# Patient Record
Sex: Female | Born: 1955 | Race: White | Hispanic: No | Smoking: Former smoker
Health system: Southern US, Community
[De-identification: ages and names within clinical notes are randomized; demographics above are authoritative.]

## PROBLEM LIST (undated history)

## (undated) DIAGNOSIS — N840 Polyp of corpus uteri: Secondary | ICD-10-CM

## (undated) DIAGNOSIS — E079 Disorder of thyroid, unspecified: Secondary | ICD-10-CM

## (undated) DIAGNOSIS — Z1501 Genetic susceptibility to malignant neoplasm of breast: Secondary | ICD-10-CM

## (undated) DIAGNOSIS — Z9229 Personal history of other drug therapy: Secondary | ICD-10-CM

## (undated) DIAGNOSIS — Z1509 Genetic susceptibility to other malignant neoplasm: Secondary | ICD-10-CM

## (undated) DIAGNOSIS — Z9852 Vasectomy status: Secondary | ICD-10-CM

## (undated) DIAGNOSIS — Z789 Other specified health status: Secondary | ICD-10-CM

## (undated) DIAGNOSIS — M1990 Unspecified osteoarthritis, unspecified site: Secondary | ICD-10-CM

## (undated) DIAGNOSIS — R928 Other abnormal and inconclusive findings on diagnostic imaging of breast: Secondary | ICD-10-CM

## (undated) HISTORY — DX: Vasectomy status: Z98.52

## (undated) HISTORY — DX: Disorder of thyroid, unspecified: E07.9

## (undated) HISTORY — DX: Polyp of corpus uteri: N84.0

## (undated) HISTORY — PX: HYSTEROSCOPY: SHX211

## (undated) HISTORY — PX: BREAST SURGERY: SHX581

## (undated) HISTORY — DX: Personal history of other drug therapy: Z92.29

## (undated) HISTORY — PX: TONSILLECTOMY: SUR1361

## (undated) HISTORY — DX: Other abnormal and inconclusive findings on diagnostic imaging of breast: R92.8

---

## 1998-01-18 ENCOUNTER — Other Ambulatory Visit: Admission: RE | Admit: 1998-01-18 | Discharge: 1998-01-18 | Payer: Self-pay | Admitting: Obstetrics and Gynecology

## 1998-03-14 ENCOUNTER — Other Ambulatory Visit: Admission: RE | Admit: 1998-03-14 | Discharge: 1998-03-14 | Payer: Self-pay | Admitting: Obstetrics and Gynecology

## 1998-10-04 ENCOUNTER — Other Ambulatory Visit: Admission: RE | Admit: 1998-10-04 | Discharge: 1998-10-04 | Payer: Self-pay | Admitting: Obstetrics and Gynecology

## 1999-02-02 ENCOUNTER — Other Ambulatory Visit: Admission: RE | Admit: 1999-02-02 | Discharge: 1999-02-02 | Payer: Self-pay | Admitting: Obstetrics and Gynecology

## 1999-05-11 ENCOUNTER — Encounter: Admission: RE | Admit: 1999-05-11 | Discharge: 1999-05-11 | Payer: Self-pay | Admitting: Sports Medicine

## 1999-08-22 ENCOUNTER — Ambulatory Visit (HOSPITAL_COMMUNITY): Admission: RE | Admit: 1999-08-22 | Discharge: 1999-08-22 | Payer: Self-pay | Admitting: Obstetrics and Gynecology

## 1999-08-22 ENCOUNTER — Encounter (INDEPENDENT_AMBULATORY_CARE_PROVIDER_SITE_OTHER): Payer: Self-pay

## 2000-02-07 ENCOUNTER — Other Ambulatory Visit: Admission: RE | Admit: 2000-02-07 | Discharge: 2000-02-07 | Payer: Self-pay | Admitting: Obstetrics and Gynecology

## 2001-02-12 ENCOUNTER — Other Ambulatory Visit: Admission: RE | Admit: 2001-02-12 | Discharge: 2001-02-12 | Payer: Self-pay | Admitting: Obstetrics and Gynecology

## 2001-11-23 ENCOUNTER — Encounter: Payer: Self-pay | Admitting: Family Medicine

## 2001-11-23 ENCOUNTER — Encounter: Admission: RE | Admit: 2001-11-23 | Discharge: 2001-11-23 | Payer: Self-pay | Admitting: Family Medicine

## 2002-02-01 ENCOUNTER — Ambulatory Visit (HOSPITAL_COMMUNITY): Admission: RE | Admit: 2002-02-01 | Discharge: 2002-02-01 | Payer: Self-pay | Admitting: Gastroenterology

## 2002-03-09 ENCOUNTER — Other Ambulatory Visit: Admission: RE | Admit: 2002-03-09 | Discharge: 2002-03-09 | Payer: Self-pay | Admitting: Obstetrics and Gynecology

## 2002-11-19 ENCOUNTER — Encounter: Admission: RE | Admit: 2002-11-19 | Discharge: 2002-11-19 | Payer: Self-pay | Admitting: Sports Medicine

## 2002-12-28 ENCOUNTER — Encounter: Admission: RE | Admit: 2002-12-28 | Discharge: 2002-12-28 | Payer: Self-pay | Admitting: Sports Medicine

## 2003-03-22 ENCOUNTER — Other Ambulatory Visit: Admission: RE | Admit: 2003-03-22 | Discharge: 2003-03-22 | Payer: Self-pay | Admitting: Obstetrics and Gynecology

## 2003-10-25 ENCOUNTER — Encounter: Admission: RE | Admit: 2003-10-25 | Discharge: 2003-10-25 | Payer: Self-pay | Admitting: Sports Medicine

## 2003-11-22 ENCOUNTER — Encounter: Admission: RE | Admit: 2003-11-22 | Discharge: 2003-11-22 | Payer: Self-pay | Admitting: Sports Medicine

## 2004-03-29 ENCOUNTER — Other Ambulatory Visit: Admission: RE | Admit: 2004-03-29 | Discharge: 2004-03-29 | Payer: Self-pay | Admitting: Obstetrics and Gynecology

## 2004-05-22 ENCOUNTER — Encounter: Admission: RE | Admit: 2004-05-22 | Discharge: 2004-05-22 | Payer: Self-pay | Admitting: Family Medicine

## 2004-09-07 ENCOUNTER — Ambulatory Visit: Payer: Self-pay | Admitting: Family Medicine

## 2005-04-02 ENCOUNTER — Other Ambulatory Visit: Admission: RE | Admit: 2005-04-02 | Discharge: 2005-04-02 | Payer: Self-pay | Admitting: Obstetrics and Gynecology

## 2005-06-12 ENCOUNTER — Encounter: Admission: RE | Admit: 2005-06-12 | Discharge: 2005-06-12 | Payer: Self-pay | Admitting: *Deleted

## 2005-06-18 ENCOUNTER — Encounter: Admission: RE | Admit: 2005-06-18 | Discharge: 2005-06-18 | Payer: Self-pay | Admitting: Family Medicine

## 2006-04-04 ENCOUNTER — Other Ambulatory Visit: Admission: RE | Admit: 2006-04-04 | Discharge: 2006-04-04 | Payer: Self-pay | Admitting: Obstetrics and Gynecology

## 2007-04-09 ENCOUNTER — Other Ambulatory Visit: Admission: RE | Admit: 2007-04-09 | Discharge: 2007-04-09 | Payer: Self-pay | Admitting: Obstetrics and Gynecology

## 2007-09-16 ENCOUNTER — Encounter: Admission: RE | Admit: 2007-09-16 | Discharge: 2007-09-16 | Payer: Self-pay | Admitting: Gastroenterology

## 2008-04-14 ENCOUNTER — Encounter: Payer: Self-pay | Admitting: Obstetrics and Gynecology

## 2008-04-14 ENCOUNTER — Other Ambulatory Visit: Admission: RE | Admit: 2008-04-14 | Discharge: 2008-04-14 | Payer: Self-pay | Admitting: Obstetrics and Gynecology

## 2008-04-14 ENCOUNTER — Ambulatory Visit: Payer: Self-pay | Admitting: Obstetrics and Gynecology

## 2008-06-06 ENCOUNTER — Ambulatory Visit: Payer: Self-pay | Admitting: Obstetrics and Gynecology

## 2008-08-22 ENCOUNTER — Ambulatory Visit: Payer: Self-pay | Admitting: Sports Medicine

## 2008-08-22 DIAGNOSIS — R109 Unspecified abdominal pain: Secondary | ICD-10-CM

## 2008-11-10 ENCOUNTER — Ambulatory Visit: Payer: Self-pay | Admitting: Sports Medicine

## 2008-11-10 DIAGNOSIS — M79609 Pain in unspecified limb: Secondary | ICD-10-CM

## 2008-11-10 DIAGNOSIS — M7750 Other enthesopathy of unspecified foot: Secondary | ICD-10-CM | POA: Insufficient documentation

## 2009-02-14 ENCOUNTER — Ambulatory Visit: Payer: Self-pay | Admitting: Sports Medicine

## 2009-02-14 DIAGNOSIS — M722 Plantar fascial fibromatosis: Secondary | ICD-10-CM

## 2009-03-08 ENCOUNTER — Ambulatory Visit: Payer: Self-pay | Admitting: Sports Medicine

## 2009-04-19 ENCOUNTER — Telehealth: Payer: Self-pay | Admitting: Family Medicine

## 2009-04-19 ENCOUNTER — Ambulatory Visit: Payer: Self-pay | Admitting: Sports Medicine

## 2009-04-19 ENCOUNTER — Encounter: Admission: RE | Admit: 2009-04-19 | Discharge: 2009-04-19 | Payer: Self-pay | Admitting: Sports Medicine

## 2009-04-19 DIAGNOSIS — M25559 Pain in unspecified hip: Secondary | ICD-10-CM

## 2009-04-19 DIAGNOSIS — M545 Low back pain: Secondary | ICD-10-CM

## 2009-05-10 ENCOUNTER — Ambulatory Visit: Payer: Self-pay | Admitting: Obstetrics and Gynecology

## 2009-05-10 ENCOUNTER — Other Ambulatory Visit: Admission: RE | Admit: 2009-05-10 | Discharge: 2009-05-10 | Payer: Self-pay | Admitting: Obstetrics and Gynecology

## 2009-08-30 ENCOUNTER — Ambulatory Visit: Payer: Self-pay | Admitting: Obstetrics and Gynecology

## 2010-01-17 ENCOUNTER — Ambulatory Visit: Payer: Self-pay | Admitting: Obstetrics and Gynecology

## 2010-01-31 ENCOUNTER — Ambulatory Visit: Payer: Self-pay | Admitting: Obstetrics and Gynecology

## 2010-05-27 DIAGNOSIS — R928 Other abnormal and inconclusive findings on diagnostic imaging of breast: Secondary | ICD-10-CM

## 2010-05-27 HISTORY — DX: Other abnormal and inconclusive findings on diagnostic imaging of breast: R92.8

## 2010-05-27 HISTORY — PX: BREAST BIOPSY: SHX20

## 2010-05-29 ENCOUNTER — Other Ambulatory Visit
Admission: RE | Admit: 2010-05-29 | Discharge: 2010-05-29 | Payer: Self-pay | Source: Home / Self Care | Admitting: Obstetrics and Gynecology

## 2010-05-29 ENCOUNTER — Ambulatory Visit
Admission: RE | Admit: 2010-05-29 | Discharge: 2010-05-29 | Payer: Self-pay | Source: Home / Self Care | Attending: Obstetrics and Gynecology | Admitting: Obstetrics and Gynecology

## 2010-06-12 ENCOUNTER — Encounter
Admission: RE | Admit: 2010-06-12 | Discharge: 2010-06-12 | Payer: Self-pay | Source: Home / Self Care | Attending: Radiology | Admitting: Radiology

## 2010-06-20 ENCOUNTER — Encounter
Admission: RE | Admit: 2010-06-20 | Discharge: 2010-06-20 | Payer: Self-pay | Source: Home / Self Care | Attending: Radiology | Admitting: Radiology

## 2010-06-20 ENCOUNTER — Other Ambulatory Visit: Payer: Self-pay | Admitting: Diagnostic Radiology

## 2010-07-16 ENCOUNTER — Institutional Professional Consult (permissible substitution) (INDEPENDENT_AMBULATORY_CARE_PROVIDER_SITE_OTHER): Payer: BC Managed Care – PPO | Admitting: Obstetrics and Gynecology

## 2010-07-16 DIAGNOSIS — N63 Unspecified lump in unspecified breast: Secondary | ICD-10-CM

## 2010-07-17 ENCOUNTER — Institutional Professional Consult (permissible substitution): Payer: Self-pay | Admitting: Obstetrics and Gynecology

## 2010-10-12 NOTE — Op Note (Signed)
   NAMELAKEIDRA, Pace                         ACCOUNT NO.:  [id number]   MEDICAL RECORD NO.:  [id number]                   PATIENT TYPE:  AMB   LOCATION:  ENDO                                 FACILITY:  [facility]   PHYSICIAN:  Florencia Reasons, M.D.             DATE OF BIRTH:  06-22-1955   DATE OF PROCEDURE:  02/01/2002  DATE OF DISCHARGE:                                 OPERATIVE REPORT   PROCEDURE:  Colonoscopy.   INDICATIONS:  A 55 year old female with history of intermittent small-volume  hematochezia.   FINDINGS:  Essentially normal exam.   DESCRIPTION OF PROCEDURE:  The nature, purpose, and risks of the procedure  had been discussed with the patient, who provided written consent.  The  Olympus standard pediatric video colonoscope was advanced around the colon  with some slight looping, which was overcome by some external abdominal  compression.  The cecum was identified by clear visualization of the  appendiceal orifice, and pullback was then performed.  The quality of the  prep was excellent, and it is felt that all areas were well-seen.   This was a normal examination.  No polyps, cancer, colitis, vascular  malformations, or diverticulosis were noted, and retroflexion in the rectum  was unremarkable apart from some hypertrophied anal papillae.  Pullout  through the anal canal similarly did not show any significant internal  hemorrhoids beyond the usual expected.   No biopsies were obtained.  The patient tolerated the procedure well, and  there were no apparent complications.   IMPRESSION:  1. Normal colonoscopy.  2. No specific source of intermittent small-volume hematochezia identified.     By exclusion, based on the absence of any distal colorectal pathology     being visualized, I presume that the patient's bleeding is of     hemorrhoidal origin and the patient probably experiences periodic     engorgement of her hemorrhoids to account for the bleeding.   PLAN:   Consider sigmoidoscopic evaluation in a couple of years if the  bleeding continues.  Consider full colonoscopy in five to 10 years for  ongoing colon cancer screening.                                               Florencia Reasons, M.D.    RVB/MEDQ  D:  02/01/2002  T:  02/01/2002  Job:  [id number]   cc:   Carolyne Fiscal, M.D.

## 2010-10-12 NOTE — Op Note (Signed)
[facility]  Patient:    Ashley Pace, Ashley Pace                      MRN: [id number] Proc. Date: 08/20/99 Adm. Date:  [id number] Attending:  Sharon Mt                           Operative Report  PREOPERATIVE DIAGNOSIS:       Menorrhagia with intrauterine cavity defect.  POSTOPERATIVE DIAGNOSIS:      Same.  OPERATION:                    Hysteroscopy with resection of polyp versus leiomyoma.  SURGEON:                      Daniel L. Eda Paschal, M.D.  ANESTHESIA:                   General.  FINDINGS:                     External and vaginal examination is within normal  limits.  Cervix is clean.  Uterus is severely retroverted, normal size and shape. Adnexa are palpably normal.  At the time of hysteroscopy there was a 2 cm lesion coming off the left lateral wall of the uterus that had both polyp and intracavitary myoma characteristics, although it seemed to be more consistent with a polyp than with a myoma.  Once it had been excised the top of the fundus, tubal ostia, anterior and posterior walls of the fundus, and lower uterine segment were all within normal limits as was the intracervical area.  PROCEDURE:                    After adequate general endotracheal anesthesia patient was placed in dorsal lithotomy position and prepped and draped in the usual sterile manner.  A single tooth tenaculum was placed on the anterior lip of the  cervix.  The cervix was dilated to a number 33 Pratt dilator.  The hysteroscopic resectoscope was introduced.  A camera was used for magnification.  Sorbitol 3% was used to expand the intrauterine cavity.  An excellent view was obtained and the  lesion that had been seen on sonohysterogram in the office was exactly in the same position that it was seen.  Using a 90 degree wire loop and Bovie settings of 70, coag 110 cutting on blend one the lesion was removed.  There were several large  bleeders in it  and they were first cauterized and then the lesion was removed with the resectoscope until it was entirely out.  It was sent to pathology for tissue diagnosis.  Pictures have been taken for documentation before starting the procedure.  Pictures were taken afterwards demonstrating that the lesion was gone. There were no other lesions present, therefore the procedure was terminated. Estimated blood loss was less than 100 cc with none replaced.  Fluid deficit was 100 cc.  The patient left the operating room in satisfactory condition. DD:  08/22/99 TD:  08/22/99 Job: [id number]

## 2010-11-14 ENCOUNTER — Other Ambulatory Visit: Payer: Self-pay | Admitting: Obstetrics and Gynecology

## 2010-11-14 DIAGNOSIS — Z9889 Other specified postprocedural states: Secondary | ICD-10-CM

## 2010-12-11 ENCOUNTER — Inpatient Hospital Stay: Admission: RE | Admit: 2010-12-11 | Payer: BC Managed Care – PPO | Source: Ambulatory Visit

## 2011-02-05 ENCOUNTER — Other Ambulatory Visit: Payer: Self-pay | Admitting: *Deleted

## 2011-02-05 DIAGNOSIS — N63 Unspecified lump in unspecified breast: Secondary | ICD-10-CM

## 2011-02-06 ENCOUNTER — Other Ambulatory Visit: Payer: Self-pay | Admitting: Obstetrics and Gynecology

## 2011-02-06 ENCOUNTER — Encounter: Payer: Self-pay | Admitting: Obstetrics and Gynecology

## 2011-02-06 DIAGNOSIS — N63 Unspecified lump in unspecified breast: Secondary | ICD-10-CM

## 2011-02-07 ENCOUNTER — Other Ambulatory Visit: Payer: Self-pay | Admitting: Radiology

## 2011-02-07 DIAGNOSIS — Z803 Family history of malignant neoplasm of breast: Secondary | ICD-10-CM

## 2011-02-07 DIAGNOSIS — Z9889 Other specified postprocedural states: Secondary | ICD-10-CM

## 2011-02-07 DIAGNOSIS — Z1501 Genetic susceptibility to malignant neoplasm of breast: Secondary | ICD-10-CM

## 2011-02-12 ENCOUNTER — Ambulatory Visit
Admission: RE | Admit: 2011-02-12 | Discharge: 2011-02-12 | Disposition: A | Discharge status: Home / Self Care | Payer: BC Managed Care – PPO | Source: Ambulatory Visit | Attending: Radiology | Admitting: Radiology

## 2011-02-12 DIAGNOSIS — Z803 Family history of malignant neoplasm of breast: Secondary | ICD-10-CM

## 2011-02-12 DIAGNOSIS — Z9889 Other specified postprocedural states: Secondary | ICD-10-CM

## 2011-02-12 DIAGNOSIS — Z1501 Genetic susceptibility to malignant neoplasm of breast: Secondary | ICD-10-CM

## 2011-02-12 MED ORDER — GADOBENATE DIMEGLUMINE 529 MG/ML IV SOLN
12.0000 mL | Freq: Once | INTRAVENOUS | Status: AC | PRN
  Administered 2011-02-12: 12 mL via INTRAVENOUS

## 2011-02-26 ENCOUNTER — Encounter: Payer: Self-pay | Admitting: Obstetrics and Gynecology

## 2011-05-20 ENCOUNTER — Other Ambulatory Visit: Payer: Self-pay | Admitting: Obstetrics and Gynecology

## 2011-05-22 ENCOUNTER — Other Ambulatory Visit: Payer: Self-pay | Admitting: *Deleted

## 2011-05-22 DIAGNOSIS — Z09 Encounter for follow-up examination after completed treatment for conditions other than malignant neoplasm: Secondary | ICD-10-CM

## 2011-05-29 DIAGNOSIS — Z9852 Vasectomy status: Secondary | ICD-10-CM | POA: Insufficient documentation

## 2011-05-29 DIAGNOSIS — N840 Polyp of corpus uteri: Secondary | ICD-10-CM | POA: Insufficient documentation

## 2011-05-29 DIAGNOSIS — J45909 Unspecified asthma, uncomplicated: Secondary | ICD-10-CM | POA: Insufficient documentation

## 2011-05-29 DIAGNOSIS — Z9229 Personal history of other drug therapy: Secondary | ICD-10-CM | POA: Insufficient documentation

## 2011-05-29 DIAGNOSIS — R928 Other abnormal and inconclusive findings on diagnostic imaging of breast: Secondary | ICD-10-CM | POA: Insufficient documentation

## 2011-06-03 ENCOUNTER — Encounter: Payer: Self-pay | Admitting: Obstetrics and Gynecology

## 2011-06-03 ENCOUNTER — Other Ambulatory Visit: Payer: Self-pay | Admitting: Obstetrics and Gynecology

## 2011-06-03 DIAGNOSIS — Z09 Encounter for follow-up examination after completed treatment for conditions other than malignant neoplasm: Secondary | ICD-10-CM

## 2011-06-06 ENCOUNTER — Ambulatory Visit (INDEPENDENT_AMBULATORY_CARE_PROVIDER_SITE_OTHER): Payer: BC Managed Care – PPO | Admitting: Obstetrics and Gynecology

## 2011-06-06 ENCOUNTER — Encounter: Payer: Self-pay | Admitting: Obstetrics and Gynecology

## 2011-06-06 ENCOUNTER — Other Ambulatory Visit (HOSPITAL_COMMUNITY)
Admission: RE | Admit: 2011-06-06 | Discharge: 2011-06-06 | Disposition: A | Discharge status: Home / Self Care | Payer: BC Managed Care – PPO | Source: Ambulatory Visit | Attending: Obstetrics and Gynecology | Admitting: Obstetrics and Gynecology

## 2011-06-06 VITALS — BP 120/74 | Ht 65.0 in | Wt 150.0 lb

## 2011-06-06 DIAGNOSIS — Z833 Family history of diabetes mellitus: Secondary | ICD-10-CM

## 2011-06-06 DIAGNOSIS — E079 Disorder of thyroid, unspecified: Secondary | ICD-10-CM | POA: Insufficient documentation

## 2011-06-06 DIAGNOSIS — E039 Hypothyroidism, unspecified: Secondary | ICD-10-CM

## 2011-06-06 DIAGNOSIS — Z01419 Encounter for gynecological examination (general) (routine) without abnormal findings: Secondary | ICD-10-CM

## 2011-06-06 LAB — URINALYSIS W MICROSCOPIC + REFLEX CULTURE
Glucose, UA: NEGATIVE mg/dL
Hgb urine dipstick: NEGATIVE
Ketones, ur: NEGATIVE mg/dL
Leukocytes, UA: NEGATIVE
Protein, ur: NEGATIVE mg/dL

## 2011-06-06 MED ORDER — THYROID 60 MG PO TABS: 60.0000 mg | ORAL_TABLET | Freq: Every day | ORAL | Status: DC

## 2011-06-06 MED ORDER — PROGESTERONE MICRONIZED 200 MG PO CAPS: 200.0000 mg | ORAL_CAPSULE | ORAL | Status: DC

## 2011-06-06 MED ORDER — ESTRADIOL 0.05 MG/24HR TD PTWK: 1.0000 | MEDICATED_PATCH | TRANSDERMAL | Status: DC

## 2011-06-06 NOTE — Progress Notes (Signed)
The patient came back to see me today for an annual GYN exam. After an abnormal MRI followed by a biopsy she has now had a normal mammogram this month. She has a mutation of BRCA2 of uncertain significance. She remains on hormone replacement therapy for greater than 5 years with relief of all her menopausal symptoms. She is a normal bone density. She has monthly withdrawal with her HRT. She is having no pelvic pain. We are treating the patient for hypothyroidism. She is doing well on her Armour Thyroid.  Physical examination:  Kennon Portela present. HEENT within normal limits. Neck: Thyroid not large. No masses. Supraclavicular nodes: not enlarged. Breasts: Examined in both sitting midline position. No skin changes and no masses. Abdomen: Soft no guarding rebound or masses or hernia. Pelvic: External: Within normal limits. BUS: Within normal limits. Vaginal:within normal limits. Good estrogen effect. No evidence of cystocele rectocele or enterocele. Cervix: clean. Uterus: Normal size and shape. Adnexa: No masses. Rectovaginal exam: Confirmatory and negative. Extremities: Within normal limits.  Assessment: #1. Menopausal symptoms #2. Mother with early onset breast cancer #3. BRCA2 mutation of uncertain significance #4. Hypothyroidism.  Plan: Discussed increased risk of breast cancer with HRT. Patient feels benefits outweighed the risks. She will continue HRT. She will continue yearly mammograms. She will continue her Armour Thyroid.

## 2011-06-07 ENCOUNTER — Other Ambulatory Visit: Payer: Self-pay | Admitting: Obstetrics and Gynecology

## 2011-06-07 DIAGNOSIS — E7800 Pure hypercholesterolemia, unspecified: Secondary | ICD-10-CM

## 2011-06-07 LAB — CBC WITH DIFFERENTIAL/PLATELET
Basophils Absolute: 0 10*3/uL (ref 0.0–0.1)
Basophils Relative: 0 % (ref 0–1)
Eosinophils Absolute: 0 10*3/uL (ref 0.0–0.7)
Eosinophils Relative: 0 % (ref 0–5)
HCT: 42.9 % (ref 36.0–46.0)
Hemoglobin: 14.8 g/dL (ref 12.0–15.0)
MCH: 32.2 pg (ref 26.0–34.0)
MCHC: 34.5 g/dL (ref 30.0–36.0)
Monocytes Absolute: 0.7 10*3/uL (ref 0.1–1.0)
Monocytes Relative: 9 % (ref 3–12)
Neutro Abs: 4.7 10*3/uL (ref 1.7–7.7)
RDW: 13.5 % (ref 11.5–15.5)

## 2011-06-11 ENCOUNTER — Other Ambulatory Visit: Payer: BC Managed Care – PPO

## 2011-06-11 DIAGNOSIS — E7800 Pure hypercholesterolemia, unspecified: Secondary | ICD-10-CM

## 2011-06-11 LAB — LIPID PANEL
Total CHOL/HDL Ratio: 3 Ratio
VLDL: 24 mg/dL (ref 0–40)

## 2012-02-07 ENCOUNTER — Encounter (HOSPITAL_BASED_OUTPATIENT_CLINIC_OR_DEPARTMENT_OTHER): Payer: Self-pay | Admitting: *Deleted

## 2012-02-07 NOTE — Progress Notes (Signed)
No labs needed

## 2012-02-11 ENCOUNTER — Other Ambulatory Visit: Payer: Self-pay | Admitting: Orthopedic Surgery

## 2012-02-12 ENCOUNTER — Ambulatory Visit (HOSPITAL_BASED_OUTPATIENT_CLINIC_OR_DEPARTMENT_OTHER): Payer: BC Managed Care – PPO | Admitting: Anesthesiology

## 2012-02-12 ENCOUNTER — Encounter (HOSPITAL_BASED_OUTPATIENT_CLINIC_OR_DEPARTMENT_OTHER): Payer: Self-pay | Admitting: Anesthesiology

## 2012-02-12 ENCOUNTER — Encounter (HOSPITAL_BASED_OUTPATIENT_CLINIC_OR_DEPARTMENT_OTHER): Payer: Self-pay | Admitting: *Deleted

## 2012-02-12 ENCOUNTER — Ambulatory Visit (HOSPITAL_BASED_OUTPATIENT_CLINIC_OR_DEPARTMENT_OTHER)
Admission: RE | Admit: 2012-02-12 | Discharge: 2012-02-12 | Disposition: A | Discharge status: Home / Self Care | Payer: BC Managed Care – PPO | Source: Ambulatory Visit | Attending: Orthopedic Surgery | Admitting: Orthopedic Surgery

## 2012-02-12 ENCOUNTER — Encounter (HOSPITAL_BASED_OUTPATIENT_CLINIC_OR_DEPARTMENT_OTHER)
Admission: RE | Disposition: A | Discharge status: Home / Self Care | Payer: Self-pay | Source: Ambulatory Visit | Attending: Orthopedic Surgery

## 2012-02-12 DIAGNOSIS — S63279A Dislocation of unspecified interphalangeal joint of unspecified finger, initial encounter: Secondary | ICD-10-CM | POA: Insufficient documentation

## 2012-02-12 DIAGNOSIS — S63639A Sprain of interphalangeal joint of unspecified finger, initial encounter: Secondary | ICD-10-CM | POA: Insufficient documentation

## 2012-02-12 DIAGNOSIS — X500XXA Overexertion from strenuous movement or load, initial encounter: Secondary | ICD-10-CM | POA: Insufficient documentation

## 2012-02-12 DIAGNOSIS — Y92009 Unspecified place in unspecified non-institutional (private) residence as the place of occurrence of the external cause: Secondary | ICD-10-CM | POA: Insufficient documentation

## 2012-02-12 HISTORY — DX: Other specified health status: Z78.9

## 2012-02-12 HISTORY — DX: Unspecified osteoarthritis, unspecified site: M19.90

## 2012-02-12 SURGERY — OPEN REDUCTION INTERNAL FIXATION (ORIF) PROXIMAL PHALANX
Anesthesia: General | Site: Finger | Laterality: Left | Wound class: Clean

## 2012-02-12 MED ORDER — ONDANSETRON HCL 4 MG/2ML IJ SOLN
INTRAMUSCULAR | Status: DC | PRN
  Administered 2012-02-12: 4 mg via INTRAVENOUS

## 2012-02-12 MED ORDER — LACTATED RINGERS IV SOLN
INTRAVENOUS | Status: DC
  Administered 2012-02-12 (×2): via INTRAVENOUS

## 2012-02-12 MED ORDER — HYDROCODONE-ACETAMINOPHEN 5-500 MG PO TABS: 1.0000 | ORAL_TABLET | ORAL | Status: AC | PRN

## 2012-02-12 MED ORDER — CHLORHEXIDINE GLUCONATE 4 % EX LIQD: 60.0000 mL | Freq: Once | CUTANEOUS | Status: DC

## 2012-02-12 MED ORDER — LIDOCAINE HCL (CARDIAC) 20 MG/ML IV SOLN
INTRAVENOUS | Status: DC | PRN
  Administered 2012-02-12: 80 mg via INTRAVENOUS

## 2012-02-12 MED ORDER — FENTANYL CITRATE 0.05 MG/ML IJ SOLN
INTRAMUSCULAR | Status: DC | PRN
  Administered 2012-02-12 (×2): 25 ug via INTRAVENOUS

## 2012-02-12 MED ORDER — PROPOFOL 10 MG/ML IV BOLUS
INTRAVENOUS | Status: DC | PRN
  Administered 2012-02-12: 230 mg via INTRAVENOUS

## 2012-02-12 MED ORDER — HYDROMORPHONE HCL PF 1 MG/ML IJ SOLN: 0.2500 mg | INTRAMUSCULAR | Status: DC | PRN

## 2012-02-12 MED ORDER — DEXAMETHASONE SODIUM PHOSPHATE 4 MG/ML IJ SOLN
INTRAMUSCULAR | Status: DC | PRN
  Administered 2012-02-12: 10 mg via INTRAVENOUS

## 2012-02-12 MED ORDER — BUPIVACAINE HCL (PF) 0.25 % IJ SOLN
INTRAMUSCULAR | Status: DC | PRN
  Administered 2012-02-12: 5 mL

## 2012-02-12 MED ORDER — ONDANSETRON HCL 4 MG/2ML IJ SOLN: 4.0000 mg | Freq: Four times a day (QID) | INTRAMUSCULAR | Status: DC | PRN

## 2012-02-12 MED ORDER — GLYCOPYRROLATE 0.2 MG/ML IJ SOLN
INTRAMUSCULAR | Status: DC | PRN
  Administered 2012-02-12: 0.2 mg via INTRAVENOUS

## 2012-02-12 MED ORDER — CEFAZOLIN SODIUM-DEXTROSE 2-3 GM-% IV SOLR
2.0000 g | INTRAVENOUS | Status: AC
  Administered 2012-02-12: 2 g via INTRAVENOUS

## 2012-02-12 SURGICAL SUPPLY — 54 items
BANDAGE COBAN STERILE 2 (GAUZE/BANDAGES/DRESSINGS) IMPLANT
BANDAGE GAUZE ELAST BULKY 4 IN (GAUZE/BANDAGES/DRESSINGS) IMPLANT
BLADE MINI RND TIP GREEN BEAV (BLADE) ×1 IMPLANT
BLADE SURG 15 STRL LF DISP TIS (BLADE) ×1 IMPLANT
BLADE SURG 15 STRL SS (BLADE) ×2
BNDG CMPR 9X4 STRL LF SNTH (GAUZE/BANDAGES/DRESSINGS) ×1
BNDG COHESIVE 1X5 TAN STRL LF (GAUZE/BANDAGES/DRESSINGS) ×1 IMPLANT
BNDG COHESIVE 3X5 TAN STRL LF (GAUZE/BANDAGES/DRESSINGS) ×1 IMPLANT
BNDG ESMARK 4X9 LF (GAUZE/BANDAGES/DRESSINGS) ×2 IMPLANT
CHLORAPREP W/TINT 26ML (MISCELLANEOUS) ×2 IMPLANT
CLOTH BEACON ORANGE TIMEOUT ST (SAFETY) ×2 IMPLANT
CORDS BIPOLAR (ELECTRODE) ×2 IMPLANT
COVER MAYO STAND STRL (DRAPES) ×2 IMPLANT
COVER TABLE BACK 60X90 (DRAPES) ×2 IMPLANT
CUFF TOURNIQUET SINGLE 18IN (TOURNIQUET CUFF) ×1 IMPLANT
DECANTER SPIKE VIAL GLASS SM (MISCELLANEOUS) IMPLANT
DRAPE EXTREMITY T 121X128X90 (DRAPE) ×2 IMPLANT
DRAPE OEC MINIVIEW 54X84 (DRAPES) ×2 IMPLANT
DRAPE SURG 17X23 STRL (DRAPES) ×2 IMPLANT
GAUZE XEROFORM 1X8 LF (GAUZE/BANDAGES/DRESSINGS) ×2 IMPLANT
GLOVE BIO SURGEON STRL SZ 6.5 (GLOVE) ×3 IMPLANT
GLOVE BIOGEL PI IND STRL 7.0 (GLOVE) IMPLANT
GLOVE BIOGEL PI IND STRL 8.5 (GLOVE) ×1 IMPLANT
GLOVE BIOGEL PI INDICATOR 7.0 (GLOVE) ×2
GLOVE BIOGEL PI INDICATOR 8.5 (GLOVE) ×1
GLOVE SURG ORTHO 8.0 STRL STRW (GLOVE) ×2 IMPLANT
GOWN BRE IMP PREV XXLGXLNG (GOWN DISPOSABLE) ×2 IMPLANT
GOWN PREVENTION PLUS XLARGE (GOWN DISPOSABLE) ×2 IMPLANT
KWIRE 4.0 X .035IN (WIRE) ×1 IMPLANT
NEEDLE 27GAX1X1/2 (NEEDLE) ×1 IMPLANT
NS IRRIG 1000ML POUR BTL (IV SOLUTION) ×2 IMPLANT
PACK BASIN DAY SURGERY FS (CUSTOM PROCEDURE TRAY) ×2 IMPLANT
PAD CAST 3X4 CTTN HI CHSV (CAST SUPPLIES) ×1 IMPLANT
PADDING CAST ABS 4INX4YD NS (CAST SUPPLIES) ×1
PADDING CAST ABS COTTON 4X4 ST (CAST SUPPLIES) ×1 IMPLANT
PADDING CAST COTTON 3X4 STRL (CAST SUPPLIES)
SLEEVE SCD COMPRESS KNEE MED (MISCELLANEOUS) ×1 IMPLANT
SPLINT FNGR PLAIN END 5/8X3.25 (CAST SUPPLIES) IMPLANT
SPLINT PLASTALUME 3 1/4 (CAST SUPPLIES) ×2
SPLINT PLASTER CAST XFAST 3X15 (CAST SUPPLIES) IMPLANT
SPLINT PLASTER XTRA FASTSET 3X (CAST SUPPLIES)
SPONGE GAUZE 4X4 12PLY (GAUZE/BANDAGES/DRESSINGS) ×2 IMPLANT
STOCKINETTE 4X48 STRL (DRAPES) ×2 IMPLANT
SUT CHROMIC 5 0 P 3 (SUTURE) IMPLANT
SUT MERSILENE 4 0 P 3 (SUTURE) IMPLANT
SUT MERSILENE 6 0 S14 DA (SUTURE) ×1 IMPLANT
SUT VICRYL 4-0 PS2 18IN ABS (SUTURE) IMPLANT
SUT VICRYL RAPID 5 0 P 3 (SUTURE) IMPLANT
SUT VICRYL RAPIDE 4/0 PS 2 (SUTURE) ×2 IMPLANT
SYR BULB 3OZ (MISCELLANEOUS) ×2 IMPLANT
SYR CONTROL 10ML LL (SYRINGE) ×1 IMPLANT
TOWEL OR 17X24 6PK STRL BLUE (TOWEL DISPOSABLE) ×2 IMPLANT
UNDERPAD 30X30 INCONTINENT (UNDERPADS AND DIAPERS) ×2 IMPLANT
WATER STERILE IRR 1000ML POUR (IV SOLUTION) ×1 IMPLANT

## 2012-02-12 NOTE — Anesthesia Postprocedure Evaluation (Signed)
Anesthesia Post Note  Patient: Ashley Pace  Procedure(s) Performed: Procedure(s) (LRB): OPEN REDUCTION INTERNAL FIXATION (ORIF) PROXIMAL PHALANX (Left)  Anesthesia type: General  Patient location: PACU  Post pain: Pain level controlled and Adequate analgesia  Post assessment: Post-op Vital signs reviewed, Patient's Cardiovascular Status Stable, Respiratory Function Stable, Patent Airway and Pain level controlled  Last Vitals:  Filed Vitals:   02/12/12 1300  BP: 150/83  Pulse: 93  Temp:   Resp: 13    Post vital signs: Reviewed and stable  Level of consciousness: awake, alert  and oriented  Complications: No apparent anesthesia complications

## 2012-02-12 NOTE — H&P (Signed)
Ashley Pace is a 56 year-old right-hand dominant female referred by Dr. Anda Kraft and Dr. Tenny Craw for consultation with respect to an injury to her left middle finger.  The injury occurred on 01/24/12 when she was caught by her dog's leash as the dog bolted.  She was unable to extend the PIP joint of her little finger. She was seen at the emergency department.  They placed her in a splint and gave her hydrocodone. The splint maintained her finger in a flexed position at the PIP joint.  She has no prior history of injury. She is complaining of pain about the PIP joint . She did not have to manipulate it in position, has simply been unable to extend it.  She was in [place] at the time.  She complains of moderate throbbing aching pain with a feeling of swelling and weakness.  Activity makes it worse. Rest, heat and ice have helped. She has been taking hydrocodone which upsets her stomach.  She has history of thyroid problems.  No history of diabetes, arthritis or gout.  No prior history of injury to the finger.    She has been serially casted. She is unable to fully extend.  She comes up to approximately a 20 to 25 degree flexion deformity, but the joint is not congruous.  I suspect this is a complex dislocation of her finger in addition to the extensor tendon rupture.  This is going to require surgical intervention in an effort to repair this.    ALLERGIES:   Sulfa.     MEDICATIONS:    Thyroid medicine, progesterone, estrogen, AllerClear.  SURGICAL HISTORY:     She has had fibroids removed.    FAMILY MEDICAL HISTORY:     Positive for diabetes, otherwise negative.  SOCIAL HISTORY:      She does not smoke, she drinks socially.  She is married and works as a Nurse, adult for [employer].  REVIEW OF SYSTEMS:    Positive for contacts, easy bruising, otherwise negative 14 points. Ashley Pace is an 56 y.o. female.   Chief Complaint: complex dislocation PIP LMF HPI: SEE ABOVE  Past Medical  History  Diagnosis Date  . Endometrial polyp   . History of postmenopausal HRT     TOOK HRT FOR 3-3 1/2 YEARS STOPPED 2010  . H/O: vasectomy     PARTNER WITH VASECTOMY  . Asthma   . 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 Thyroid disease     hypothyroid  . No pertinent past medical history   . Arthritis     Past Surgical History  Procedure Date  . Hysteroscopy     WITH RESECTION OF POLYP  . Breast surgery     Biopsy-benign  . Tonsillectomy     Family History  Problem Relation Age of Onset  . Breast cancer Mother 69  . Cancer Paternal Aunt     UTERINE  . Diabetes Paternal Aunt   . Cancer Paternal Grandmother     UTERINE  . Diabetes Paternal Grandfather    Social History:  reports that she quit smoking about 27 years ago. She does not have any smokeless tobacco history on file. She reports that she drinks about 5 ounces of alcohol per week. She reports that she does not use illicit drugs.  Allergies:  Allergies  Allergen Reactions  . Sulfa Antibiotics     Medications Prior to Admission  Medication Sig Dispense Refill  .  Ascorbic Acid (VITAMIN C) 1000 MG tablet Take 1,000 mg by mouth daily.        Marland Kitchen aspirin 81 MG chewable tablet Chew 81 mg by mouth daily.       . cetirizine (ZYRTEC) 10 MG tablet Take 10 mg by mouth daily.        . cholecalciferol (VITAMIN D) 1000 UNITS tablet Take 1,000 Units by mouth daily.        Marland Kitchen estradiol (CLIMARA - DOSED IN MG/24 HR) 0.05 mg/24hr Place 1 patch (0.05 mg total) onto the skin once a week.  12 patch  4  . fluticasone (VERAMYST) 27.5 MCG/SPRAY nasal spray Place 2 sprays into the nose daily.        Marland Kitchen HYDROcodone-acetaminophen (NORCO/VICODIN) 5-325 MG per tablet Take 1 tablet by mouth every 6 (six) hours as needed.      . Multiple Vitamin (MULTIVITAMIN) capsule Take 1 capsule by mouth daily.        . progesterone (PROMETRIUM) 200 MG capsule Take 1 capsule (200 mg total) by mouth as directed. Take  days 1-12 every month  36 capsule  4  . thyroid (ARMOUR THYROID) 60 MG tablet Take 1 tablet (60 mg total) by mouth daily.  90 tablet  4  . traMADol (ULTRAM) 50 MG tablet Take 50 mg by mouth every 6 (six) hours as needed.      Marland Kitchen AMITRIPTYLINE HCL PO Take by mouth.        Vedia Coffer Cohosh (REMIFEMIN PO) Take by mouth.          No results found for this or any previous visit (from the past 48 hour(s)).  No results found.   Pertinent items are noted in HPI.  Blood pressure 164/87, pulse 49, temperature 97.9 F (36.6 C), temperature source Oral, resp. rate 18, height 5\' 5"  (1.651 m), weight 69.854 kg (154 lb), last menstrual period 05/06/2011, SpO2 99.00%.  General appearance: alert, cooperative and appears stated age Head: Normocephalic, without obvious abnormality Neck: no adenopathy Resp: clear to auscultation bilaterally Cardio: regular rate and rhythm, S1, S2 normal, no murmur, click, rub or gallop GI: soft, non-tender; bowel sounds normal; no masses,  no organomegaly Extremities: extremities normal, atraumatic, no cyanosis or edema Pulses: 2+ and symmetric Skin: Skin color, texture, turgor normal. No rashes or lesions Neurologic: Grossly normal Incision/Wound: NA  Assessment/Plan Plan ORIF PIP LMF complex dislocation  Ashley Pace R 02/12/2012, 11:46 AM

## 2012-02-12 NOTE — Anesthesia Procedure Notes (Signed)
Procedure Name: LMA Insertion Date/Time: 02/12/2012 12:08 PM Performed by: Gar Gibbon Pre-anesthesia Checklist: Patient identified, Emergency Drugs available, Suction available and Patient being monitored Patient Re-evaluated:Patient Re-evaluated prior to inductionOxygen Delivery Method: Circle System Utilized Preoxygenation: Pre-oxygenation with 100% oxygen Intubation Type: IV induction Ventilation: Mask ventilation without difficulty LMA: LMA inserted LMA Size: 4.0 Number of attempts: 1 Airway Equipment and Method: bite block Placement Confirmation: positive ETCO2 Tube secured with: Tape Dental Injury: Teeth and Oropharynx as per pre-operative assessment

## 2012-02-12 NOTE — Op Note (Signed)
Dictated number: [id number]

## 2012-02-12 NOTE — Brief Op Note (Signed)
02/12/2012  12:54 PM  PATIENT:  Ashley Pace  56 y.o. female  PRE-OPERATIVE DIAGNOSIS:  Dislocation left middle finger proximal interphalangeal joint  POST-OPERATIVE DIAGNOSIS:  Dislocation left middle finger proximal interphalangeal joint  PROCEDURE:  Procedure(s) (LRB) with comments: OPEN REDUCTION INTERNAL FIXATION (ORIF) PROXIMAL PHALANX (Left) - ORIF PIP joint left middle finger, repair ligament, possible extensor tendon, pinning of PIP  SURGEON:  Surgeon(s) and Role:    * Nicki Reaper, MD - Primary  PHYSICIAN ASSISTANT:   ASSISTANTS: none   ANESTHESIA:   local and general  EBL:  Total I/O In: 1000 [I.V.:1000] Out: -   BLOOD ADMINISTERED:none  DRAINS: none   LOCAL MEDICATIONS USED:  MARCAINE     SPECIMEN:  No Specimen  DISPOSITION OF SPECIMEN:  N/A  COUNTS:  YES  TOURNIQUET:   Total Tourniquet Time Documented: Upper Arm (Left) - 30 minutes  DICTATION: .Other Dictation: Dictation Number [id number]  PLAN OF CARE: Discharge to home after PACU  PATIENT DISPOSITION:  PACU - hemodynamically stable.

## 2012-02-12 NOTE — Anesthesia Preprocedure Evaluation (Signed)
Anesthesia Evaluation  Patient identified by MRN, date of birth, ID band Patient awake    Reviewed: Allergy & Precautions, H&P , NPO status , Patient's Chart, lab work & pertinent test results  Airway Mallampati: II  Neck ROM: full    Dental   Pulmonary asthma ,          Cardiovascular     Neuro/Psych    GI/Hepatic   Endo/Other    Renal/GU      Musculoskeletal   Abdominal   Peds  Hematology   Anesthesia Other Findings   Reproductive/Obstetrics                           Anesthesia Physical Anesthesia Plan  ASA: II  Anesthesia Plan: General   Post-op Pain Management:    Induction: Intravenous  Airway Management Planned: LMA  Additional Equipment:   Intra-op Plan:   Post-operative Plan:   Informed Consent: I have reviewed the patients History and Physical, chart, labs and discussed the procedure including the risks, benefits and alternatives for the proposed anesthesia with the patient or authorized representative who has indicated his/her understanding and acceptance.     Plan Discussed with: CRNA and Surgeon  Anesthesia Plan Comments:         Anesthesia Quick Evaluation  

## 2012-02-12 NOTE — Transfer of Care (Signed)
Immediate Anesthesia Transfer of Care Note  Patient: Ashley Pace  Procedure(s) Performed: Procedure(s) (LRB) with comments: OPEN REDUCTION INTERNAL FIXATION (ORIF) PROXIMAL PHALANX (Left) - ORIF PIP joint left middle finger, repair ligament, possible extensor tendon, pinning of PIP  Patient Location: PACU  Anesthesia Type: General  Level of Consciousness: awake and oriented  Airway & Oxygen Therapy: Patient Spontanous Breathing and Patient connected to face mask oxygen  Post-op Assessment: Report given to PACU RN and Post -op Vital signs reviewed and stable  Post vital signs: Reviewed and stable  Complications: No apparent anesthesia complications

## 2012-02-13 LAB — POCT HEMOGLOBIN-HEMACUE: Hemoglobin: 15.3 g/dL — ABNORMAL HIGH (ref 12.0–15.0)

## 2012-02-13 NOTE — Op Note (Signed)
NAMEALAZAE, CRYMES               ACCOUNT NO.:  [id number]  MEDICAL RECORD NO.:  [id number]  LOCATION:                                 FACILITY:  PHYSICIAN:  Cindee Salt, M.D.            DATE OF BIRTH:  DATE OF PROCEDURE:  02/12/2012 DATE OF DISCHARGE:                              OPERATIVE REPORT   PREOPERATIVE DIAGNOSIS:  Complex dislocation, proximal interphalangeal joint, left middle finger.  POSTOPERATIVE DIAGNOSIS:  Complex dislocation, proximal interphalangeal joint, left middle finger.  OPERATION:  Open reduction and internal fixation with repair of radial collateral ligament, proximal interphalangeal joint, left middle finger.  SURGEON:  Cindee Salt, M.D.  ANESTHESIA:  General with metacarpal block.  ANESTHESIOLOGIST:  Achille Rich, MD  HISTORY:  The patient is a 56 year old female who suffered an injury to her left middle finger leaving in the fully flexed position.  She was unable to straighten it indicative of a probable dislocation with rupture of the central slip.  Serial casting was able to bring this back up to nearly extended position; however, the joint was not congruous. She is admitted now for open reduction and internal fixation, repair of collateral ligament, possible central slip complex dislocation, left middle finger.  Pre, peri and postoperative course have been discussed along with risks and complications.  She is aware that there is no guarantee with the surgery; possibility of infection; recurrence of injury to arteries, nerves, tendons; incomplete relief of symptoms and dystrophy.  In the preoperative area, the patient is seen, the extremity marked by both the patient and surgeon, and antibiotic given.  PROCEDURE:  The patient was brought to the operating room where a general anesthetic was carried out without difficulty.  She was prepped using ChloraPrep, supine position, left arm free.  A 3-minute dry time was allowed.  Time-out taken,  confirming the patient and procedure.  The limb was exsanguinated with an Esmarch bandage.  Tourniquet placed high on the arm was inflated to 250 mmHg.  A curvilinear incision was made over the PIP joint, left middle finger, carried down through the subcutaneous tissue.  Complex dislocation was immediately apparent. Moderate scar was present.  The joint was opened after incising the lateral band connection to the central slip.  The central slip appeared to be intact of the middle phalanx.  A significant erosion of the dorsal cartilage was present on the radial aspect of the proximal phalanx.  The wound was copiously irrigated with saline.  Portions of the collateral ligament were then repaired with figure-of-eight 6-0 Mersilene suture after pinning the joint in full extension with a 3.5 K-wire, this was bent, cut short.  The extensor tendon hood was then repaired with figure- of-eight 6-0 Mersilene sutures.  The skin was repaired with interrupted 4-0 Vicryl Rapide and metacarpal block with 0.25% Marcaine without epinephrine was given.  A sterile compressive dressing and splint to the finger applied.  On deflation of the tourniquet, remaining fingers were pinked.  She was taken to the recovery room for observation in satisfactory condition.  She will be discharge on Vicodin to return in 1 week.  ______________________________ Cindee Salt, M.D.     GK/MEDQ  D:  02/12/2012  T:  02/13/2012  Job:  [id number]

## 2012-07-11 ENCOUNTER — Other Ambulatory Visit: Payer: Self-pay

## 2012-07-30 ENCOUNTER — Other Ambulatory Visit: Payer: Self-pay | Admitting: Obstetrics and Gynecology

## 2012-07-30 DIAGNOSIS — Z1509 Genetic susceptibility to other malignant neoplasm: Secondary | ICD-10-CM

## 2012-08-06 ENCOUNTER — Other Ambulatory Visit: Payer: BC Managed Care – PPO

## 2012-08-19 ENCOUNTER — Other Ambulatory Visit: Payer: Self-pay | Admitting: Obstetrics and Gynecology

## 2012-08-19 NOTE — Telephone Encounter (Signed)
Pt will be called to schedule annual exam. KW 

## 2012-09-01 ENCOUNTER — Ambulatory Visit
Admission: RE | Admit: 2012-09-01 | Discharge: 2012-09-01 | Disposition: A | Discharge status: Home / Self Care | Payer: BC Managed Care – PPO | Source: Ambulatory Visit | Attending: Obstetrics and Gynecology | Admitting: Obstetrics and Gynecology

## 2012-09-01 DIAGNOSIS — Z1501 Genetic susceptibility to malignant neoplasm of breast: Secondary | ICD-10-CM

## 2012-09-01 MED ORDER — GADOBENATE DIMEGLUMINE 529 MG/ML IV SOLN
14.0000 mL | Freq: Once | INTRAVENOUS | Status: AC | PRN
  Administered 2012-09-01: 14 mL via INTRAVENOUS

## 2013-04-01 ENCOUNTER — Other Ambulatory Visit: Payer: Self-pay

## 2014-01-13 ENCOUNTER — Ambulatory Visit (INDEPENDENT_AMBULATORY_CARE_PROVIDER_SITE_OTHER): Payer: BC Managed Care – PPO | Admitting: Sports Medicine

## 2014-01-13 ENCOUNTER — Encounter: Payer: Self-pay | Admitting: Sports Medicine

## 2014-01-13 VITALS — BP 117/80 | HR 63 | Ht 66.0 in | Wt 144.0 lb

## 2014-01-13 DIAGNOSIS — M21612 Bunion of left foot: Principal | ICD-10-CM

## 2014-01-13 DIAGNOSIS — M21619 Bunion of unspecified foot: Secondary | ICD-10-CM | POA: Diagnosis not present

## 2014-01-13 DIAGNOSIS — M7750 Other enthesopathy of unspecified foot: Secondary | ICD-10-CM

## 2014-01-13 DIAGNOSIS — M21611 Bunion of right foot: Secondary | ICD-10-CM | POA: Insufficient documentation

## 2014-01-13 NOTE — Assessment & Plan Note (Addendum)
Orthotics that seem to help the bunion pain on the right She has 2 pairs that are in good shape Replaced metatarsal pads  Left bunion we modified with a L pad on orthotic She was also given bunion shields Gel pad Tubular foam  Recheck as needed  These works well she will continue using that  She should continue her orthotics

## 2014-01-13 NOTE — Assessment & Plan Note (Signed)
Metatarsal pads given

## 2014-01-13 NOTE — Progress Notes (Signed)
Patient ID: Ashley LeanNancy B Pace, female   DOB: 02/05/1956, 58 y.o.   MRN: [id number]  Patient with bilateral bunions The right one has more deviation but the left one is more painful Her mother had significant bunions and actually had surgery The patient doesn't want surgery and her mother did not have that good of a result  We have made her orthotics and does help She is doing a walking program and wants to get back into a bit of running  Her husband died this past year and the exercises a good stress reliever  Metatarsal pads have helped the metatarsal pain she had in the past  Physical examination No acute distress BP 117/80  Pulse 63  Ht 5\' 6"  (1.676 m)  Wt 144 lb (65.318 kg)  BMI 23.25 kg/m2  LMP 05/06/2011  Right foot shows a bunion with moderate hallux valgus shift Moderate loss of the long arch Moderate loss of the transverse arch without any severe calluses Good motion of the right first MTP  Left foot shows slight valgus shift and bunion change There is more spurring over the side There is more callusing over the first MTP and PIP joint on the medial side Morton's callus under the transverse arch Good motion Moderate loss of longitudinal and transverse arch is

## 2014-01-13 NOTE — Patient Instructions (Signed)
Try different padded products to take pressure off bunions  Use red orthotics for more sport  Some shoes that make a difference  HOKAs  Sock liner shoes - which means that the upper is soft and puts no pressure at heel or forefoot  Experiment!!  Also try arnica gel massage or aspercream massage/ flexall 454

## 2014-03-11 ENCOUNTER — Other Ambulatory Visit: Payer: Self-pay

## 2014-11-08 ENCOUNTER — Other Ambulatory Visit: Payer: Self-pay | Admitting: Obstetrics and Gynecology

## 2014-11-08 DIAGNOSIS — Z1501 Genetic susceptibility to malignant neoplasm of breast: Secondary | ICD-10-CM

## 2014-11-23 ENCOUNTER — Other Ambulatory Visit: Payer: BC Managed Care – PPO

## 2014-11-25 ENCOUNTER — Ambulatory Visit
Admission: RE | Admit: 2014-11-25 | Discharge: 2014-11-25 | Disposition: A | Discharge status: Home / Self Care | Payer: BC Managed Care – PPO | Source: Ambulatory Visit | Attending: Obstetrics and Gynecology | Admitting: Obstetrics and Gynecology

## 2014-11-25 DIAGNOSIS — Z1501 Genetic susceptibility to malignant neoplasm of breast: Secondary | ICD-10-CM

## 2014-11-25 MED ORDER — GADOBENATE DIMEGLUMINE 529 MG/ML IV SOLN
13.0000 mL | Freq: Once | INTRAVENOUS | Status: AC | PRN
  Administered 2014-11-25: 13 mL via INTRAVENOUS

## 2014-12-05 ENCOUNTER — Other Ambulatory Visit (HOSPITAL_COMMUNITY): Payer: Self-pay | Admitting: Obstetrics and Gynecology

## 2014-12-05 ENCOUNTER — Ambulatory Visit (HOSPITAL_COMMUNITY)
Admission: RE | Admit: 2014-12-05 | Discharge: 2014-12-05 | Disposition: A | Discharge status: Home / Self Care | Payer: BC Managed Care – PPO | Source: Ambulatory Visit | Attending: Obstetrics and Gynecology | Admitting: Obstetrics and Gynecology

## 2014-12-05 DIAGNOSIS — R938 Abnormal findings on diagnostic imaging of other specified body structures: Secondary | ICD-10-CM | POA: Insufficient documentation

## 2014-12-05 DIAGNOSIS — R9389 Abnormal findings on diagnostic imaging of other specified body structures: Secondary | ICD-10-CM

## 2016-01-05 ENCOUNTER — Ambulatory Visit (INDEPENDENT_AMBULATORY_CARE_PROVIDER_SITE_OTHER): Payer: BC Managed Care – PPO | Admitting: Family Medicine

## 2016-01-05 ENCOUNTER — Encounter: Payer: Self-pay | Admitting: Family Medicine

## 2016-01-05 ENCOUNTER — Ambulatory Visit
Admission: RE | Admit: 2016-01-05 | Discharge: 2016-01-05 | Disposition: A | Discharge status: Home / Self Care | Payer: BC Managed Care – PPO | Source: Ambulatory Visit | Attending: Family Medicine | Admitting: Family Medicine

## 2016-01-05 VITALS — BP 142/72 | Ht 65.0 in | Wt 143.0 lb

## 2016-01-05 DIAGNOSIS — M79605 Pain in left leg: Secondary | ICD-10-CM

## 2016-01-05 NOTE — Assessment & Plan Note (Addendum)
I think she has a small tear in the anterior tibialis muscle. Given the nature of her fall with twisting while still bearing weight on the left leg and ultimately  falling onto her right hip, this makes sense. I think her pain going up and down hill is from ankle motion. Since the pain has worsened with her activities during this week, I would like to treat her with some immobilization for the next 5-7 days. We'll place her in a cam walker boot as this is really the only way to mobilizer ankle. I was going to have her follow-up in a week but [place] I gave her instructions to come out of the boot in 57 days for 2 hours and see how she did. She does well she can continue to taper up her activity. If She has problems she will call us.  Also sent she's not going to be able to follow-up as I had hoped, I'll go ahead and get some plain films to rule out any specific bony pathology that we missed on ultrasound.

## 2016-01-05 NOTE — Progress Notes (Signed)
  Werner Leanancy B Billingham - 60 y.o. female MRN [id number]  Date of birth: 06/15/1955    SUBJECTIVE:     Chief Complaint: Left lower leg pain  HPI: 6 days of left lower leg pain after a fall area she had a fall while walking her dog ended up landing on her right hip. Does not recall hitting anything with her left leg but did notice a small scraped area on the anterior shin which is where it hurts. The pain has gotten worse during the week, this morning was significantly worse. She's not really noticed any swelling. Area is not red and only mildly tender to palpation but walking especially walking up or down hill is quite painful. She typically walks 4 miles a day plus walking her dog.   ROS:     She's noted no fevers, seen no erythema or swelling of the left lower leg. She's had no left leg calf pain. She has no other unusual arthralgias. She does have a large bruise on her right hip.  PERTINENT  PMH / PSH FH / / SH:  Past Medical, Surgical, Social, and Family History Reviewed & Updated in the EMR.  Pertinent findings include:  History of thyroid disease Asthma History of postmenopausal status  OBJECTIVE: BP (!) 142/72   Ht 5\' 5"  (1.651 m)   Wt 143 lb (64.9 kg)   LMP 05/06/2011   BMI 23.80 kg/m   Physical Exam:  Vital signs are reviewed. GEN.: Well-developed female no acute distress EXTREMITY: Left lower leg reveals no erythema, no swelling. Very mildly tender to palpation over the anterior tibialis muscle. ANKLE: Plantarflexion dorsiflexion strength and range of motion is intact. With extreme resisted dorsiflexion she has some mild pain in the left anterior leg where she's been experiencing symptoms. VASCULAR: Dorsalis pedis pulses 2+ bilaterally equal SKIN: Very small 1-2 mm scabbed area over her left anterior shin just below the area where she has some pain. Does not look infected. No induration.  ULTRASOUND: Normal-appearing cortex of the tibia and fibula. There is no fluid noted in the ankle  joint. Right over the area of tenderness on the anterior tibialis muscle is a small area of fiber disruption in the muscle area. There's a small amount of increased and Doppler activity here.  ASSESSMENT & PLAN:  See problem based charting & AVS for pt instructions.

## 2016-01-26 ENCOUNTER — Ambulatory Visit (INDEPENDENT_AMBULATORY_CARE_PROVIDER_SITE_OTHER): Payer: BC Managed Care – PPO | Admitting: Family Medicine

## 2016-01-26 DIAGNOSIS — M25572 Pain in left ankle and joints of left foot: Secondary | ICD-10-CM

## 2016-01-28 NOTE — Progress Notes (Signed)
    CHIEF COMPLAINT / HPI:  1. F/u left shin (anterior tibialis m) pain. Much better. Wore the boot as recommended and then only tapered back into activity  two days before she walked 4 miles. She then developed the second issue (#2) which is left ankle stiffness and pain with some bruising. No swelling. Putthe boot back on and has now worn it during day for several days. Ankle feel s better but she is concerned about removing boot for activity.  REVIEW OF SYSTEMS:  See hpi  OBJECTIVE:  Vital signs are reviewed.  GEN: WD WN NAD LLE: anterior shin is nontender to palpation. Dorsiflexion and plantarflexion do not cause shin pain. Weight bearing painless. ANKL:E: Left: some mild diffuse ecchymoses anteriorly on ankle. Ligamentously intact. FROM. Normal strength.No soft tissue swelling  ASSESSMENT / PLAN: Anterior tibialis muscle strain seems resolved. I tok she developed some mild ankle weakness while in boot and then did not taper back up to activity slowly enough before she went out and did a 4 mile walk. We discussed taper back to normal activities> Ankle HEP program given and explained in detail. She can f/u PRN. D/C boot.

## 2016-05-09 ENCOUNTER — Ambulatory Visit (INDEPENDENT_AMBULATORY_CARE_PROVIDER_SITE_OTHER): Payer: BC Managed Care – PPO | Admitting: Sports Medicine

## 2016-05-09 ENCOUNTER — Encounter: Payer: Self-pay | Admitting: Sports Medicine

## 2016-05-09 VITALS — BP 153/78 | Ht 65.0 in | Wt 145.0 lb

## 2016-05-09 DIAGNOSIS — M21612 Bunion of left foot: Secondary | ICD-10-CM

## 2016-05-09 DIAGNOSIS — M21611 Bunion of right foot: Secondary | ICD-10-CM

## 2016-05-09 NOTE — Assessment & Plan Note (Signed)
Will treat with orthotics. He can follow-up as needed. Custom orthotics made for both sneakers and formal shoes.

## 2016-05-09 NOTE — Progress Notes (Signed)
  Ashley LeanNancy B Pace - 60 y.o. female MRN [id number]  Date of birth: 01/22/1956  SUBJECTIVE:  Including CC & ROS.  CC: bilateral foot pain  Presents for bilateral foot pain and bunion follow-up. She has had orthotics made previously which have been helpful to help with her pain from her bunions. She reports pain at her first MTP joint bilaterally and along the first metatarsal bone. She notices it more when she is doing yoga and having to extend her first digit posteriorly.  She would like to orthotics made. Denies any numbness or tingling distally.   ROS: No unexpected weight loss, fever, chills, swelling, instability, muscle pain, numbness/tingling, redness, otherwise see HPI   PMHx - Updated and reviewed.  Contributory factors include: Thyroid disorder, asthma PSHx - Updated and reviewed.  Contributory factors include:  Negative FHx - Updated and reviewed.  Contributory factors include:  Negative Social Hx - Updated and reviewed. Contributory factors include: Negative Medications - reviewed   DATA REVIEWED: Previous office visits  PHYSICAL EXAM:  VS: BP:(!) 153/78  HR: bpm  TEMP: ( )  RESP:   HT:5\' 5"  (165.1 cm)   WT:145 lb (65.8 kg)  BMI:24.2 PHYSICAL EXAM: Gen: NAD, alert, cooperative with exam, well-appearing HEENT: clear conjunctiva,  CV:  no edema, capillary refill brisk, normal rate Resp: non-labored Skin: no rashes, normal turgor  Neuro: no gross deficits.  Psych:  alert and oriented  Ankle & Foot: Has calluses on the medial aspect of her first phalanx bilaterally. Arch: Normal w/o pes cavus or planus, but does have fallen arches upon standing  She has bilateral hallux valgus of the first digit Full in plantarflexion, dorsiflexion, inversion, and eversion of the foot; flexion and extension of the toes Strength: 5/5 in all directions. Sensation: intact Vascular: intact w/ dorsalis pedis & posterior tibialis pulses 2+  Gait  Overall balanced gait with appropriate base  width and stride length  Foot:  pronation; No in-toeing or out-toeing; No foot slap or high steppage gait  Knee: extension and flexion adequate w/o genu varus or valgus  Hip: No circumduction or contralateral drop  Trunk: Neutral w/o Pace  Shoe evaluation: Significant wear along her metatarsal phalangeal joints      ASSESSMENT & PLAN:   Bilateral bunions Will treat with orthotics. He can follow-up as needed. Custom orthotics made for both sneakers and formal shoes.   Patient was fitted for a : standard, cushioned, semi-rigid orthotic. The orthotic was heated and afterward the patient stood on the orthotic blank positioned on the orthotic stand. The patient was positioned in subtalar neutral position and 10 degrees of ankle dorsiflexion in a weight bearing stance. After completion of molding, a stable base was applied to the orthotic blank. The blank was ground to a stable position for weight bearing. Size:6 Base: Blue EVA Posting: Metatarsal pad  Patient was fitted for a : standard, cushioned, semi-rigid orthotic. The orthotic was heated and afterward the patient stood on the orthotic blank positioned on the orthotic stand. The patient was positioned in subtalar neutral position and 10 degrees of ankle dorsiflexion in a weight bearing stance. After completion of molding, a stable base was applied to the orthotic blank. The blank was ground to a stable position for weight bearing. Size: 6 Base: black high density tapered EVA Posting: Metatarsal pad  Patient was counseled reviewing diagnosis and treatment in detail, totaling in 50 minutes, over half of which was spent in face to face counseling.

## 2016-05-14 ENCOUNTER — Other Ambulatory Visit: Payer: Self-pay | Admitting: Obstetrics and Gynecology

## 2016-05-14 DIAGNOSIS — Z1231 Encounter for screening mammogram for malignant neoplasm of breast: Secondary | ICD-10-CM

## 2016-06-21 ENCOUNTER — Ambulatory Visit
Admission: RE | Admit: 2016-06-21 | Discharge: 2016-06-21 | Disposition: A | Discharge status: Home / Self Care | Payer: BC Managed Care – PPO | Source: Ambulatory Visit | Attending: Obstetrics and Gynecology | Admitting: Obstetrics and Gynecology

## 2016-06-21 DIAGNOSIS — Z1231 Encounter for screening mammogram for malignant neoplasm of breast: Secondary | ICD-10-CM

## 2016-07-16 ENCOUNTER — Ambulatory Visit (INDEPENDENT_AMBULATORY_CARE_PROVIDER_SITE_OTHER): Payer: BC Managed Care – PPO | Admitting: Sports Medicine

## 2016-07-16 ENCOUNTER — Encounter: Payer: Self-pay | Admitting: Sports Medicine

## 2016-07-16 VITALS — BP 142/81 | HR 51 | Ht 65.0 in | Wt 145.0 lb

## 2016-07-16 DIAGNOSIS — M25572 Pain in left ankle and joints of left foot: Secondary | ICD-10-CM

## 2016-07-16 NOTE — Progress Notes (Signed)
   Subjective:    Patient ID: Ashley Pace, female    DOB: 12/25/1955, 61 y.o.   MRN: [id number]  HPI Ms. Brynda GreathouseRandall is a 61 yo F w/ PMH of metatarsalgia and bilateral bunions in for F/U on R. Foot pain. Pt. Was seen 2 months ago for bunions > metatarsalgia pain and provided w/ orthotics w/ high metatarsal pad. Pt reports significant increase in pain two weeks ago. Now describes 10/10 pain overlying 2nd MTP of R. Foot on the plantar aspect more than dorsal. Pain worsened w/ use and she now walks w/ a limp. She denies alleviating factors and is not currently taking any anti-inflammatories. Describes some associated swelling on dorsum of foot. She denies acute trauma. Denies pain between metatarsals.   Review of Systems ROS- as per HPI    Objective:   Physical Exam  General: well-appearing F in NAD, A&Ox3  MSK: - b/l bunions - mild swelling overlying R. Dorsal aspect of foot - nl ROM of toe flexion/extension - PTP over 2nd MTP - no pain over more proximal 2nd metatarsal or between metatarsals  Limited R. Foot US (07/16/16): Effusion of R. 2nd MTP extending proximally over metatarsal c/w synovitis. No irregularity of 2nd metatarsal.     Assessment & Plan:  61 yo F w/ PMH of metatarsalgia w/ recent addition of high metatarsal pad now in w/ pain over 2nd MTP w/ US evidence of synovitis. DDx; Metatarsalgia/Synovitis vs. Acute stress reaction. No irregularity in more proximal metatarsal to indicate acute stress reaction. Synovitis likely secondary to increase in metatarsal pad.  - post-operative shoe now, wean to orthotics as tolerated - replaced one pair orthotics w/ smaller metatarsal pad - Aleve scheduled x2 days - F/U in 2 wks to assess progress and consider decreasing metatarsal pad in other orthotics  Patient seen and evaluated with the medical student. I agree with the above plan of care. Patient's ultrasound shows synovitis at the second MTP joint of her right foot. The metatarsal pads  in her current orthotics are very large and I think they are putting a little too much stress on the joint. I have replaced the larger custom made metatarsal pads with smaller metatarsal pads. I've only done this to one pair of orthotics. She will wean from her postop shoe to these orthotics as her symptoms allow. Follow-up with me in 2 weeks for reevaluation and repeat ultrasound. If her symptoms improve then we will need to replace all of the other metatarsal pads in her other orthotics.

## 2016-07-19 ENCOUNTER — Ambulatory Visit: Payer: BC Managed Care – PPO | Admitting: Family Medicine

## 2016-07-25 ENCOUNTER — Other Ambulatory Visit: Payer: Self-pay | Admitting: Obstetrics and Gynecology

## 2016-07-25 DIAGNOSIS — Z803 Family history of malignant neoplasm of breast: Secondary | ICD-10-CM

## 2016-07-30 ENCOUNTER — Ambulatory Visit (INDEPENDENT_AMBULATORY_CARE_PROVIDER_SITE_OTHER): Payer: BC Managed Care – PPO | Admitting: Sports Medicine

## 2016-07-30 ENCOUNTER — Encounter: Payer: Self-pay | Admitting: Sports Medicine

## 2016-07-30 VITALS — BP 147/86 | HR 60 | Ht 65.0 in | Wt 145.0 lb

## 2016-07-30 DIAGNOSIS — M79606 Pain in leg, unspecified: Secondary | ICD-10-CM | POA: Diagnosis not present

## 2016-07-30 DIAGNOSIS — M79671 Pain in right foot: Secondary | ICD-10-CM

## 2016-07-31 NOTE — Progress Notes (Signed)
   Subjective:    Patient ID: Ashley Pace, female    DOB: 11/11/1955, 61 y.o.   MRN: [id number]  HPI   Patient comes in today for follow-up on right foot pain. Pain has improved by about 50% but has not resolved. She has transitioned from her postop shoe to a regular shoe. She still localizes pain to the distal second metatarsal and MTP joint. She has been taking naproxen sodium without any real pain relief.    Review of Systems As above    Objective:   Physical Exam  Well-developed, well-nourished. No acute distress  Right foot: Patient still has a slight amount of soft tissue swelling across the dorsum of her second distal metatarsal. She is tender to palpation both here and at the MTP joint. No real pain with metatarsal squeeze. Neurovascularly intact distally.  MSK ultrasound of the right foot was performed. Limited images were obtained. The distal second metatarsal has an area of cortical irregularity with surrounding edema consistent with a probable stress fracture in this area. Although this is not as well as seen on the short axis view, she has significant neovascularity in the bone suggesting injury. She also has an effusion of the second MTP joint. Findings are consistent with a probable distal second metatarsal stress fracture with concomitant capsulitis.      Assessment & Plan:   Right foot pain worrisome for distal second metatarsal stress fracture versus MTP capsulitis  Today's ultrasound does suggest a possible stress fracture. I think she should continue with her postop shoe for another 3 weeks. Follow-up with me at the end of that time for repeat ultrasound. If her symptoms are not improving in the interim, she will call me and I will consider getting an x-ray of her foot prior to her follow-up visit specifically to rule out Freiberg's disease. I still think her injury is a result of the very both the metatarsal pad which was recently placed in all of her shoes for her  bunion so we've replaced all of those pads with regular metatarsal pads. Patient will discontinue her naproxen sodium and can try over-the-counter Advil 600 mg as needed. She is warned about possible GI upset.

## 2016-08-19 ENCOUNTER — Ambulatory Visit (INDEPENDENT_AMBULATORY_CARE_PROVIDER_SITE_OTHER): Payer: BC Managed Care – PPO | Admitting: Sports Medicine

## 2016-08-19 ENCOUNTER — Ambulatory Visit
Admission: RE | Admit: 2016-08-19 | Discharge: 2016-08-19 | Disposition: A | Discharge status: Home / Self Care | Payer: BC Managed Care – PPO | Source: Ambulatory Visit | Attending: Sports Medicine | Admitting: Sports Medicine

## 2016-08-19 ENCOUNTER — Encounter: Payer: Self-pay | Admitting: Sports Medicine

## 2016-08-19 VITALS — BP 149/83 | HR 64 | Ht 65.0 in | Wt 145.0 lb

## 2016-08-19 DIAGNOSIS — M79671 Pain in right foot: Secondary | ICD-10-CM | POA: Diagnosis not present

## 2016-08-19 NOTE — Progress Notes (Addendum)
   Subjective:    Patient ID: Ashley Pace, female    DOB: 05/28/1955, 61 y.o.   MRN: [id number]  HPI   Patient comes in today for follow-up on right foot pain. Her pain has improved slightly but certainly has not resolved. She has been in her postop shoe. Swelling has improved some. She has not been able to transition into a running shoe. Pain continues to be centered around the second MTP joint. She is taking intermittent doses of Advil which has been somewhat helpful.    Review of Systems As above    Objective:   Physical Exam  Well-developed, well-nourished. No acute distress  Right foot: Minimal dorsal swelling over the second MTP joint. She is tender to palpation along the distal second metatarsal and over the MTP joint. Slight tenderness over the second metatarsal head as well. Neurovascularly intact distally.  MSK ultrasound of the right foot was performed. Although previous images had suggested a possible stress fracture I do not see a definitive fracture line today. She does continue to have swelling within the MTP joint.      Assessment & Plan:   Persistent right foot pain  Differential diagnosis includes possible stress fracture, capsulitis, or possibly Friebergs disease. I'm going to start with getting x-rays of her right foot. If unremarkable, then I will proceed with an MRI specifically to differentiate between stress fracture, capsulitis, and distal second metatarsal avascular necrosis. I will follow-up with her via telephone with the results of those imaging studies when available. In the meantime, she'll continue in her postop shoe and will continue with over-the-counter ibuprofen. I did discuss the possibility of a cortisone injection into the MTP joint if no stress fracture or avascular necrosis is seen on her imaging studies.   Addendum: X-rays are unremarkable. Proceed with MRI scan as scheduled.

## 2016-08-21 ENCOUNTER — Ambulatory Visit
Admission: RE | Admit: 2016-08-21 | Discharge: 2016-08-21 | Disposition: A | Discharge status: Home / Self Care | Payer: BC Managed Care – PPO | Source: Ambulatory Visit | Attending: Sports Medicine | Admitting: Sports Medicine

## 2016-08-21 ENCOUNTER — Other Ambulatory Visit: Payer: Self-pay | Admitting: *Deleted

## 2016-08-21 ENCOUNTER — Telehealth: Payer: Self-pay | Admitting: Sports Medicine

## 2016-08-21 DIAGNOSIS — M79671 Pain in right foot: Secondary | ICD-10-CM

## 2016-08-21 MED ORDER — PREDNISONE 10 MG PO TABS: ORAL_TABLET | ORAL | 0 refills | Status: DC

## 2016-08-21 NOTE — Telephone Encounter (Signed)
I spoke with the patient on the phone today after reviewing the MRI of her right foot. She has tendinitis in both the flexor and extensor tendons of the second toe. There is also some inflammatory findings between the second and third metatarsal heads. Incidental finding of diffuse degenerative changes throughout the foot. No evidence of stress fracture or Frieberg's disease. Based on these findings I've recommended a 6 day Sterapred Dosepak. She has taken prednisone in the past without any issues. She can wean from her postop shoe back into her regular shoe with her orthotic as her symptoms allow. She will follow-up with me via telephone in 2 weeks to let me know how she is doing.

## 2016-09-10 ENCOUNTER — Encounter: Payer: Self-pay | Admitting: Sports Medicine

## 2016-09-10 ENCOUNTER — Ambulatory Visit (INDEPENDENT_AMBULATORY_CARE_PROVIDER_SITE_OTHER): Payer: BC Managed Care – PPO | Admitting: Sports Medicine

## 2016-09-10 VITALS — BP 121/83 | Ht 65.0 in | Wt 145.0 lb

## 2016-09-10 DIAGNOSIS — M79671 Pain in right foot: Secondary | ICD-10-CM

## 2016-09-10 MED ORDER — NORTRIPTYLINE HCL 25 MG PO CAPS: 25.0000 mg | ORAL_CAPSULE | Freq: Every day | ORAL | 1 refills | Status: DC

## 2016-09-10 MED ORDER — TRAMADOL HCL 50 MG PO TABS: 50.0000 mg | ORAL_TABLET | Freq: Two times a day (BID) | ORAL | 1 refills | Status: DC

## 2016-09-10 NOTE — Progress Notes (Signed)
   Subjective:    Patient ID: Ashley Pace, female    DOB: 12-Oct-1955, 61 y.o.   MRN: [id number]  HPI   Patient comes in today for follow-up on right foot pain and swelling. Her symptoms have been present now for 2 months. She continues to have pain and swelling across the dorsum of her foot around the second MTP joint. She has been treated with immobilization, orthotics, and oral prednisone. Nothing has really helped. She got no response to the oral prednisone. She does notice occasional skin color changes, specifically she notices the area will turn a dark red occasionally. An MRI done recently of her foot showed diffuse edema around the second and third metatarsals. I thought that she was dealing with some tendinitis and bursitis in this area (which was the reason for starting the prednisone) but her symptoms did not improve with the medicine. I've asked her to come in today to discuss next steps in treatment with both me and Dr. Darrick Penna.    Review of Systems    as above Objective:   Physical Exam  Well-developed, well-nourished. No acute distress  Right foot: There is moderate swelling around the second and third metatarsals dorsally on the foot. She is tender to palpation in this area both in the dorsal and plantar aspects of her foot. She does have pain with passive second and third toe range of motion. No skin color changes seen. Normal warmth. Neurovascularly intact distally.  MRI of the right foot is as above      Assessment & Plan:   2 months of right foot pain and swelling-question complex regional pain syndrome  Dr. Darrick Penna was available for consultation today. I discussed the patient's history and physical exam findings as well as reviewed her MRI with him. We think that this may be a variant of complex regional pain syndrome so we are going to treat her with 50 mg of tramadol twice daily and 25 mg of nortriptyline at night. She will also start 100 mg of vitamin B6 every day.  She may slowly start to increase her walking. Follow-up with me in 4 weeks but I've asked her to call me in 2 weeks with an update. I may need to titrate her nortriptyline prior to her follow-up visit. If she does not get any symptom relief from the nortriptyline then we may try gabapentin.

## 2016-09-26 ENCOUNTER — Telehealth: Payer: Self-pay | Admitting: *Deleted

## 2016-09-26 NOTE — Telephone Encounter (Signed)
Per Dr Margaretha Sheffieldraper, stop the Tramadol for a few days to see if the stomach discomfort subsides. Keep us updated on the outcome.

## 2016-09-27 ENCOUNTER — Telehealth: Payer: Self-pay | Admitting: Sports Medicine

## 2016-09-27 NOTE — Telephone Encounter (Signed)
Patient instructed to stop tramadol. This may be the cause of her stomach upset. Continue amitriptyline and follow-up as scheduled.

## 2016-10-08 ENCOUNTER — Ambulatory Visit (INDEPENDENT_AMBULATORY_CARE_PROVIDER_SITE_OTHER): Payer: BC Managed Care – PPO | Admitting: Sports Medicine

## 2016-10-08 ENCOUNTER — Encounter: Payer: Self-pay | Admitting: Sports Medicine

## 2016-10-08 VITALS — BP 126/75 | Ht 65.0 in | Wt 145.0 lb

## 2016-10-08 DIAGNOSIS — M79671 Pain in right foot: Secondary | ICD-10-CM

## 2016-10-08 MED ORDER — AMITRIPTYLINE HCL 25 MG PO TABS: 25.0000 mg | ORAL_TABLET | Freq: Every day | ORAL | 1 refills | Status: DC

## 2016-10-08 NOTE — Progress Notes (Signed)
   Subjective:    Patient ID: Werner LeanNancy B Samek, female    DOB: 01/16/1956, 61 y.o.   MRN: [id number]  HPI   Harriett Sineancy comes in today for follow-up on right foot pain and swelling. Unfortunately, she continues to have problems with this foot. Her swelling persists. She still gets occasional pains of different quality around her second metatarsal. Sometimes her pain is dull. Other times it is sharp. She has been able to stay active and notes that her pain is less when running than with walking. He is unable to go barefoot. She is tolerating the nortriptyline but is having difficulty sleeping. She has also noticed that the second toe on her right foot is starting to curl under. She had to discontinue her tramadol as it was causing stomach upset. She is taking vitamin B6 without any difficulty.    Review of Systems As above    Objective:   Physical Exam  Well-developed, well-nourished. No acute distress.  Right foot: Patient still has persistent mild swelling across the dorsum of the second and third metatarsals but it does seem to be improved from her last office visit. She is still diffusely tender to palpation in this area. She has full active and passive range of motion at the MTP joint but has a definite claw toe deformity beginning in the second toe. Brisk capillary refill. Walking without significant limp.      Assessment & Plan:   Right foot pain and swelling likely secondary to complex regional pain syndrome  Dr. Darrick PennaFields was available again today for consultation. We are going to try amitriptyline instead of nortriptyline since the nortriptyline may be causing some insomnia. We will start 25 mg at night but we may titrate this up in a couple of weeks if needed. Patient will continue with activity as tolerated and follow-up with us in 3 months. She will continue with her vitamin B6, 100 mg daily. We also suggested trying vitamin C and she is already taking this as well. Call with questions or  concerns prior to her follow-up visit.

## 2016-12-09 ENCOUNTER — Other Ambulatory Visit: Payer: BC Managed Care – PPO

## 2016-12-09 ENCOUNTER — Ambulatory Visit
Admission: RE | Admit: 2016-12-09 | Discharge: 2016-12-09 | Disposition: A | Discharge status: Home / Self Care | Payer: BC Managed Care – PPO | Source: Ambulatory Visit | Attending: Obstetrics and Gynecology | Admitting: Obstetrics and Gynecology

## 2016-12-09 DIAGNOSIS — Z803 Family history of malignant neoplasm of breast: Secondary | ICD-10-CM

## 2016-12-09 MED ORDER — GADOBENATE DIMEGLUMINE 529 MG/ML IV SOLN
13.0000 mL | Freq: Once | INTRAVENOUS | Status: AC | PRN
  Administered 2016-12-09: 13 mL via INTRAVENOUS

## 2016-12-12 ENCOUNTER — Other Ambulatory Visit: Payer: Self-pay | Admitting: Obstetrics and Gynecology

## 2016-12-12 DIAGNOSIS — N63 Unspecified lump in unspecified breast: Secondary | ICD-10-CM

## 2016-12-19 ENCOUNTER — Ambulatory Visit
Admission: RE | Admit: 2016-12-19 | Discharge: 2016-12-19 | Disposition: A | Discharge status: Home / Self Care | Payer: BC Managed Care – PPO | Source: Ambulatory Visit | Attending: Obstetrics and Gynecology | Admitting: Obstetrics and Gynecology

## 2016-12-19 ENCOUNTER — Other Ambulatory Visit: Payer: Self-pay | Admitting: Obstetrics and Gynecology

## 2016-12-19 DIAGNOSIS — N63 Unspecified lump in unspecified breast: Secondary | ICD-10-CM

## 2016-12-19 HISTORY — DX: Genetic susceptibility to malignant neoplasm of breast: Z15.01

## 2016-12-19 HISTORY — DX: Genetic susceptibility to other malignant neoplasm: Z15.09

## 2016-12-26 ENCOUNTER — Other Ambulatory Visit: Payer: Self-pay | Admitting: Obstetrics and Gynecology

## 2016-12-26 DIAGNOSIS — R9389 Abnormal findings on diagnostic imaging of other specified body structures: Secondary | ICD-10-CM

## 2016-12-30 ENCOUNTER — Ambulatory Visit
Admission: RE | Admit: 2016-12-30 | Discharge: 2016-12-30 | Disposition: A | Discharge status: Home / Self Care | Payer: BC Managed Care – PPO | Source: Ambulatory Visit | Attending: Obstetrics and Gynecology | Admitting: Obstetrics and Gynecology

## 2016-12-30 DIAGNOSIS — R9389 Abnormal findings on diagnostic imaging of other specified body structures: Secondary | ICD-10-CM

## 2016-12-30 MED ORDER — IOPAMIDOL (ISOVUE-300) INJECTION 61%
75.0000 mL | Freq: Once | INTRAVENOUS | Status: AC | PRN
  Administered 2016-12-30: 75 mL via INTRAVENOUS

## 2017-01-14 ENCOUNTER — Ambulatory Visit: Payer: BC Managed Care – PPO | Admitting: Sports Medicine

## 2017-01-23 ENCOUNTER — Ambulatory Visit (INDEPENDENT_AMBULATORY_CARE_PROVIDER_SITE_OTHER): Payer: BC Managed Care – PPO | Admitting: Sports Medicine

## 2017-01-23 DIAGNOSIS — M79671 Pain in right foot: Secondary | ICD-10-CM

## 2017-01-23 MED ORDER — AMITRIPTYLINE HCL 25 MG PO TABS: 25.0000 mg | ORAL_TABLET | Freq: Every day | ORAL | 1 refills | Status: DC

## 2017-01-24 NOTE — Progress Notes (Signed)
   Subjective:    Patient ID: Ashley Pace, female    DOB: 08/03/1955, 61 y.o.   MRN: [id number]  HPI   Patient comes in today for follow-up on right foot pain and swelling. Pain and swelling have improved tremendously. Her main complaint is a claw toe which has developed and is rubbing on her shoe. She continues on 25 mg of amitriptyline at night. She also continues on her vitamin B6 and vitamin C.   Review of Systems    as above Objective:   Physical Exam  Well-developed, well-nourished. No acute distress  Right foot: No obvious soft tissue swelling. She is nontender to palpation. There is an obvious claw toe deformity of the second toe. Small callus across the PIP joint of this toe. Skin is warm and normal temperature. Normal color. Good pulses. Walking without a limp.      Assessment & Plan:   Improved right foot pain and swelling likely secondary to complex regional pain syndrome Claw toe deformity, second toe right foot  Dr. Darrick PennaFields was again available for consultation today. He recommends that the patient continue on her amitriptyline for at least another 3 months. She will follow-up with me at the end of that time and if she is still doing well then we can wean her to every other day for a period of time. She will continue with her vitamin B6, 100 mg daily. Continue with vitamin C as well. We will try a toe strap for her claw toe. Continue with activity as tolerated and call with questions or concerns prior to her follow-up visit.

## 2017-04-22 ENCOUNTER — Ambulatory Visit: Payer: BC Managed Care – PPO | Admitting: Sports Medicine

## 2017-04-29 ENCOUNTER — Ambulatory Visit (INDEPENDENT_AMBULATORY_CARE_PROVIDER_SITE_OTHER): Payer: BC Managed Care – PPO | Admitting: Sports Medicine

## 2017-04-29 VITALS — BP 108/80 | Ht 65.0 in | Wt 144.0 lb

## 2017-04-29 DIAGNOSIS — S76319A Strain of muscle, fascia and tendon of the posterior muscle group at thigh level, unspecified thigh, initial encounter: Secondary | ICD-10-CM | POA: Diagnosis not present

## 2017-04-29 DIAGNOSIS — M79671 Pain in right foot: Secondary | ICD-10-CM

## 2017-04-29 NOTE — Progress Notes (Signed)
   Subjective:    Patient ID: Ashley LeanNancy B Anand, female    DOB: 10/04/1955, 61 y.o.   MRN: [id number]  HPI  Patient comes in today for follow-up on right foot pain secondary to complex regional pain syndrome. Foot is much better. She is taking 25 mg of nortriptyline daily. Also taking 100 mg of vitamin B6. She is wearing her toe strap inconsistently but continues to wear her metatarsal pad which has been extremely helpful. Her main complaint today is bilateral posterior hip pain. She injured the posterior right hip about 6 weeks ago while exercising. While stepping down from an exercise box she experienced a tearing sensation near the proximal hamstring. Since that time, her left hamstring has also started to bother her. Pain is most noticeable with lumbar flexion as well as with prolonged sitting. She had a single episode of right leg weakness and numbness and she knows that she has some lumbar degenerative disc disease and she is questioning whether or not her symptoms are due to a hamstring injury or her low back.    Review of Systems As above    Objective:   Physical Exam  Well-developed, well-nourished. No acute distress. Awake alert and oriented 3. Vital signs reviewed.  Right foot: No soft tissue swelling. Normal color. Normal temperature. Good pulses. No tenderness to palpation. There is still a claw toe deformity of the second toe with elevation of the toe with standing and a drop of the second metatarsal head. Slight callus buildup over the second metatarsal head as well.  Neurological exam shows good strength and equal reflexes in both lower extremity.  Examination of her hips show smooth painless hip range of motion with a negative log roll bilaterally. She is tender to palpation at the origin of each hamstring tendon. No palpable defect. Good strength. Neurovascular intact distally.        Assessment & Plan:   Improved right foot pain secondary to complex regional pain  syndrome Bilateral hamstring tendinopathy  We will have the patient continue her nortriptyline until February. She will continue to take it daily. Continue on her daily vitamin B6 and vitamin C. Continue with her metatarsal pad and orthotics. She will start an [place] home exercise program for her bilateral hamstring tendinopathy and I did discuss the possibility of formal physical therapy if her symptoms do not start to improve over the next couple of weeks. Patient will follow-up in 2-3 months. Call with questions or concerns in the interim.

## 2017-05-13 ENCOUNTER — Other Ambulatory Visit: Payer: Self-pay | Admitting: Obstetrics and Gynecology

## 2017-05-13 DIAGNOSIS — Z1231 Encounter for screening mammogram for malignant neoplasm of breast: Secondary | ICD-10-CM

## 2017-06-02 ENCOUNTER — Other Ambulatory Visit: Payer: Self-pay | Admitting: Obstetrics and Gynecology

## 2017-06-02 DIAGNOSIS — Z1501 Genetic susceptibility to malignant neoplasm of breast: Secondary | ICD-10-CM

## 2017-06-23 ENCOUNTER — Ambulatory Visit
Admission: RE | Admit: 2017-06-23 | Discharge: 2017-06-23 | Disposition: A | Discharge status: Home / Self Care | Payer: BC Managed Care – PPO | Source: Ambulatory Visit | Attending: Obstetrics and Gynecology | Admitting: Obstetrics and Gynecology

## 2017-06-23 DIAGNOSIS — Z1231 Encounter for screening mammogram for malignant neoplasm of breast: Secondary | ICD-10-CM

## 2017-07-04 ENCOUNTER — Other Ambulatory Visit: Payer: Self-pay | Admitting: Sports Medicine

## 2017-07-14 ENCOUNTER — Other Ambulatory Visit: Payer: BC Managed Care – PPO

## 2017-11-10 ENCOUNTER — Other Ambulatory Visit: Payer: Self-pay | Admitting: Sports Medicine

## 2017-12-02 ENCOUNTER — Other Ambulatory Visit: Payer: Self-pay | Admitting: *Deleted

## 2017-12-31 ENCOUNTER — Telehealth: Payer: Self-pay | Admitting: Nurse Practitioner

## 2017-12-31 ENCOUNTER — Ambulatory Visit
Admission: RE | Admit: 2017-12-31 | Discharge: 2017-12-31 | Disposition: A | Discharge status: Home / Self Care | Payer: BC Managed Care – PPO | Source: Ambulatory Visit | Attending: Obstetrics and Gynecology | Admitting: Obstetrics and Gynecology

## 2017-12-31 ENCOUNTER — Other Ambulatory Visit: Payer: Self-pay | Admitting: Obstetrics and Gynecology

## 2017-12-31 DIAGNOSIS — Z1501 Genetic susceptibility to malignant neoplasm of breast: Secondary | ICD-10-CM

## 2017-12-31 NOTE — Telephone Encounter (Signed)
Phone call to patient to inform patient of 13 hr prep instructions. Pt verbalized understanding of instructions. [facility] Pt instructed to take: Fri 8/9 at 10:00pm - 50mg  Prednisone Sat 8/10 at 4:00am - 50mg  Prednisone Sat 8/10 at 10:00am - 50mg  Prednisone and 50mg  Benadryl

## 2018-01-03 ENCOUNTER — Ambulatory Visit
Admission: RE | Admit: 2018-01-03 | Discharge: 2018-01-03 | Disposition: A | Discharge status: Home / Self Care | Payer: BC Managed Care – PPO | Source: Ambulatory Visit | Attending: Obstetrics and Gynecology | Admitting: Obstetrics and Gynecology

## 2018-01-03 DIAGNOSIS — Z1501 Genetic susceptibility to malignant neoplasm of breast: Secondary | ICD-10-CM

## 2018-01-03 MED ORDER — GADOBENATE DIMEGLUMINE 529 MG/ML IV SOLN
13.0000 mL | Freq: Once | INTRAVENOUS | Status: AC | PRN
  Administered 2018-01-03: 13 mL via INTRAVENOUS

## 2018-03-16 ENCOUNTER — Other Ambulatory Visit: Payer: Self-pay | Admitting: Obstetrics and Gynecology

## 2018-03-16 DIAGNOSIS — E2839 Other primary ovarian failure: Secondary | ICD-10-CM

## 2018-03-19 ENCOUNTER — Other Ambulatory Visit: Payer: Self-pay | Admitting: Obstetrics and Gynecology

## 2018-03-19 DIAGNOSIS — E2839 Other primary ovarian failure: Secondary | ICD-10-CM

## 2018-03-24 ENCOUNTER — Ambulatory Visit: Payer: BC Managed Care – PPO | Admitting: Sports Medicine

## 2018-03-24 ENCOUNTER — Encounter: Payer: Self-pay | Admitting: Sports Medicine

## 2018-03-24 ENCOUNTER — Encounter

## 2018-03-24 VITALS — BP 122/72 | Ht 65.0 in | Wt 145.0 lb

## 2018-03-24 DIAGNOSIS — G90522 Complex regional pain syndrome I of left lower limb: Secondary | ICD-10-CM | POA: Diagnosis not present

## 2018-03-24 MED ORDER — GABAPENTIN 300 MG PO CAPS: 300.0000 mg | ORAL_CAPSULE | Freq: Every day | ORAL | 2 refills | Status: DC

## 2018-03-24 NOTE — Progress Notes (Addendum)
   HPI  CC: Left foot pain  Ashley Pace is a 62 year old female presents for left foot pain.  She states his pain is been present for a 1 month.  She states it is made worse when she does any prolonged walking or walks barefoot.  She states it is worse with the first step in the morning.  She said it is localized between the second and third toes.  She has bruise on amitriptyline for similar problems in the right foot, but has been off it because she heard there is her increased risk for Alzheimer's.  She has been taken Advil with some relief periodically.  She continues to wear orthotics with metatarsal pads present.  She continues to walk 4 miles a day.  She denies any numbness and tingling shooting her foot.  She denies any weakness of her left foot.  She has no new trauma to the area.  She does not member any specific inciting event.  She denies any fevers or chills.   ROS Hx of LBP without current pain Has some Hx of radicular sxs at time into legs No current sciatica  Objective: BP 122/72   Ht 5\' 5"  (1.651 m)   Wt 145 lb (65.8 kg)   LMP 05/06/2011   BMI 24.13 kg/m  Gen: right-Hand Dominant. NAD, well groomed, a/o x3, normal affect.  CV: Well-perfused. Warm.  Resp: Non-labored.  Neuro: Sensation intact throughout. No gross coordination deficits.  Gait: Nonpathologic posture, unremarkable stride without signs of limp or balance issues.  Foot exam: No erythema or warmth.  Bunion noted on great toe.  Mild swelling noted between the second third webspace left foot.  Nonfluctuant mass noted on the medial aspect of the forefoot, around the talus.  Tenderness palpation between the second third webspace left foot.  Full range of motion eversion, inversion, dorsiflexion, plantarflexion of the foot.  Strength 5 out of 5 throughout testing.  Negative anterior drawer test.  ULTRASOUND: Foot, left  Second and third metatarsal bones visualized without any cortex defects.  Second third MTP joints  visualized without any signs of degeneration.  Webspace between the second third metatarsal head visualized without any enlargement of the nerve.  There was significant's fluid and hypoechoic changes noted between the second and third webspace.  There was significant increased flow present under Doppler.  IMPRESSION: findings consistent with inflammation between the second third webspace.   Assessment and plan:  Complex regional pain syndrome left foot.  We discussed treatment options with Harriett Sine at today's visit.  We will start her on gabapentin 300 mg at nighttime to see if this helps with some of the nerve pain she is having on her foot.  She can continue with vitamin B6 at this time as well.  We adjusted orthotics today to put in a left metatarsal cookie.  If these measures do not help much with her pain, the next step would be to evaluate for a padded forefoot orthotic.  We will see her back in 1 month.  Alric Quan, MD [facility] Fellow 03/24/2018 1:03 PM   I observed and examined the patient with the [facility] Fellow and agree with assessment and plan.  Another possibility is that the sxs in RT and now left foot may be radicular from lumbar spine.  Note reviewed and modified by me.  Enid Baas , MD

## 2018-05-11 ENCOUNTER — Other Ambulatory Visit: Payer: Self-pay | Admitting: Obstetrics and Gynecology

## 2018-05-11 DIAGNOSIS — Z1231 Encounter for screening mammogram for malignant neoplasm of breast: Secondary | ICD-10-CM

## 2018-05-26 ENCOUNTER — Other Ambulatory Visit: Payer: BC Managed Care – PPO

## 2018-06-04 ENCOUNTER — Ambulatory Visit
Admission: RE | Admit: 2018-06-04 | Discharge: 2018-06-04 | Disposition: A | Discharge status: Home / Self Care | Payer: BC Managed Care – PPO | Source: Ambulatory Visit | Attending: Obstetrics and Gynecology | Admitting: Obstetrics and Gynecology

## 2018-06-04 DIAGNOSIS — E2839 Other primary ovarian failure: Secondary | ICD-10-CM

## 2018-06-08 ENCOUNTER — Other Ambulatory Visit: Payer: BC Managed Care – PPO

## 2018-06-29 ENCOUNTER — Ambulatory Visit
Admission: RE | Admit: 2018-06-29 | Discharge: 2018-06-29 | Disposition: A | Discharge status: Home / Self Care | Payer: BC Managed Care – PPO | Source: Ambulatory Visit | Attending: Obstetrics and Gynecology | Admitting: Obstetrics and Gynecology

## 2018-06-29 DIAGNOSIS — Z1231 Encounter for screening mammogram for malignant neoplasm of breast: Secondary | ICD-10-CM

## 2018-07-03 ENCOUNTER — Other Ambulatory Visit (HOSPITAL_COMMUNITY): Payer: Self-pay | Admitting: Orthopedic Surgery

## 2018-07-03 ENCOUNTER — Ambulatory Visit (HOSPITAL_COMMUNITY)
Admission: RE | Admit: 2018-07-03 | Discharge: 2018-07-03 | Disposition: A | Discharge status: Home / Self Care | Payer: BC Managed Care – PPO | Source: Ambulatory Visit | Attending: Orthopedic Surgery | Admitting: Orthopedic Surgery

## 2018-07-03 DIAGNOSIS — M79601 Pain in right arm: Secondary | ICD-10-CM | POA: Insufficient documentation

## 2018-07-03 DIAGNOSIS — M7989 Other specified soft tissue disorders: Secondary | ICD-10-CM | POA: Insufficient documentation

## 2018-07-03 NOTE — Progress Notes (Signed)
RUE venous duplex       has been completed. Preliminary results can be found under CV proc through chart review. Jeb Levering, BS, RDMS, RVT    Negative for DVT and superficial thrombosis.

## 2019-05-19 ENCOUNTER — Other Ambulatory Visit: Payer: Self-pay | Admitting: Obstetrics and Gynecology

## 2019-05-19 DIAGNOSIS — Z1231 Encounter for screening mammogram for malignant neoplasm of breast: Secondary | ICD-10-CM

## 2019-05-31 ENCOUNTER — Encounter: Payer: Self-pay | Admitting: Podiatry

## 2019-05-31 ENCOUNTER — Other Ambulatory Visit: Payer: Self-pay

## 2019-05-31 ENCOUNTER — Ambulatory Visit: Payer: Medicare Other | Admitting: Podiatry

## 2019-05-31 ENCOUNTER — Ambulatory Visit (INDEPENDENT_AMBULATORY_CARE_PROVIDER_SITE_OTHER): Payer: Medicare PPO

## 2019-05-31 DIAGNOSIS — M21612 Bunion of left foot: Secondary | ICD-10-CM

## 2019-05-31 DIAGNOSIS — M21611 Bunion of right foot: Secondary | ICD-10-CM | POA: Diagnosis not present

## 2019-05-31 DIAGNOSIS — M2041 Other hammer toe(s) (acquired), right foot: Secondary | ICD-10-CM

## 2019-05-31 DIAGNOSIS — M84375A Stress fracture, left foot, initial encounter for fracture: Secondary | ICD-10-CM

## 2019-05-31 DIAGNOSIS — M2042 Other hammer toe(s) (acquired), left foot: Secondary | ICD-10-CM

## 2019-05-31 NOTE — Progress Notes (Signed)
Subjective:   Patient ID: Ashley Pace, female   DOB: 64 y.o.   MRN: [id number]   HPI Patient presents with bunion deformity bilateral that of gradual gotten worse and she is tried numerous shoe gear modifications without relief and has digital deformities of the fourth and fifth right fourth and fifth left and the second right with elevation of the toe.  Patient also has discomfort around the midfoot medial side left that is mild but at times can be intense.  Patient does not smoke likes to be active   Review of Systems  All other systems reviewed and are negative.       Objective:  Physical Exam Vitals and nursing note reviewed.  Constitutional:      Appearance: She is well-developed.  Pulmonary:     Effort: Pulmonary effort is normal.  Musculoskeletal:        General: Normal range of motion.  Skin:    General: Skin is warm.  Neurological:     Mental Status: She is alert.     Neurovascular status found to be intact muscle strength was found to be adequate range of motion within normal limits.  Patient is found to have elevation second digit right with probable flexor plate disruption along with structural bunion deformity bilateral and distal lesion formation digits 4 5 right fifth left with proximal lesion fourth left and pain around the cuneiform left base of first metatarsal     Assessment:  Numerous problems with structural bunion deformity hammertoe deformity and possibility for stress fracture of the left cuneiform     Plan:  H&P all conditions reviewed and at this point discussed conservative surgical options she is opted for surgical intervention.  I recommend distal osteotomy left along with distal arthroplasty digit 5 left proximal digit 4 left and immobilization for possible stress fracture navicular cuneiforms which was done today.  For the right will require distal osteotomy digital fusion digit to right and distal procedure fourth and fifth digits.  I explained  all procedure risk she wants surgery tentatively scheduled for surgery 2 weeks with a air fracture walker dispensed to begin wearing on the left foot  X-rays indicate structural bunion deformity bilateral with hammertoe deformities bilateral and reactivity around the cuneiform left which may or may not be related to discomfort she is experiencing

## 2019-06-01 ENCOUNTER — Telehealth: Payer: Self-pay | Admitting: Podiatry

## 2019-06-01 NOTE — Telephone Encounter (Signed)
Pt called to cancel postop appointments I scheduled. Stated she is coming in on 06/10/2019 to discuss, sign consent forms, and schedule surgery that day. Stated a date was not given to her when she was here yesterday and she has two upcoming appointments she does not want to go to "injured". I told her I would cancel her postop appointments and wait until she was seen next week and we have a confirmed surgery date. Pt thanked me.

## 2019-06-10 ENCOUNTER — Encounter: Payer: Self-pay | Admitting: Podiatry

## 2019-06-10 ENCOUNTER — Other Ambulatory Visit: Payer: Self-pay

## 2019-06-10 ENCOUNTER — Ambulatory Visit: Payer: Medicare PPO | Admitting: Podiatry

## 2019-06-10 DIAGNOSIS — M21612 Bunion of left foot: Secondary | ICD-10-CM | POA: Diagnosis not present

## 2019-06-10 DIAGNOSIS — M2041 Other hammer toe(s) (acquired), right foot: Secondary | ICD-10-CM | POA: Diagnosis not present

## 2019-06-10 DIAGNOSIS — M21611 Bunion of right foot: Secondary | ICD-10-CM

## 2019-06-10 DIAGNOSIS — M2042 Other hammer toe(s) (acquired), left foot: Secondary | ICD-10-CM

## 2019-06-10 NOTE — Progress Notes (Signed)
Subjective:   Patient ID: Ashley Pace, female   DOB: 64 y.o.   MRN: [id number]   HPI Patient presents stating she wants to wait on her surgery for her left foot and states that it seems that her midfoot is somewhat better using immobilization at this time   ROS      Objective:  Physical Exam  Neurovascular status intact with patient's left foot still painful but improved from previous with structural bunion deformity and hammertoe deformity fourth and fifth toe and also structural deformity of the right foot.     Assessment:  Possibility for stress fracture navicular cuneiform left and structural bunion deformity hammertoe deformity left and right foot     Plan:  H&P all conditions reviewed and discussed correction of the left foot.  I let her go over consent form reviewing distal osteotomy along with arthroplasty distal fifth proximal left fourth and I explained procedures risk and reviewed alternative treatments also.  Patient wants surgery and would like to get this done fairly soon but wants to wait till after she has an appointment to begin in February.  I reviewed everything in great length and patient understands risk and the total recovery can take approximately 6 months and is scheduled for outpatient surgery at this time and encouraged to call with any questions prior to procedure

## 2019-06-18 ENCOUNTER — Telehealth: Payer: Self-pay | Admitting: Podiatry

## 2019-06-18 NOTE — Telephone Encounter (Signed)
DOS: 07/06/2019  SURGICAL PROCEDURES: [employer] [place] NLGX([id number]) and Hammertoe Repair 4th Left and 5th Distal [phone]).  [employer] Effective Date: 05/28/2019 -  There is no deductible on this plan. Out of Pocket is $4,000 with $137.99 met and $3,862.01 remaining. CoInsurance is 100% / 0%.  There is a $250 facility copay fee.  [employer] Auth# [id number] valid from 07/06/2019 - 08/05/2019.

## 2019-06-21 ENCOUNTER — Telehealth: Payer: Self-pay | Admitting: Podiatry

## 2019-06-21 DIAGNOSIS — M84375A Stress fracture, left foot, initial encounter for fracture: Secondary | ICD-10-CM

## 2019-06-21 DIAGNOSIS — M2041 Other hammer toe(s) (acquired), right foot: Secondary | ICD-10-CM

## 2019-06-21 DIAGNOSIS — M21611 Bunion of right foot: Secondary | ICD-10-CM

## 2019-06-21 DIAGNOSIS — M2042 Other hammer toe(s) (acquired), left foot: Secondary | ICD-10-CM

## 2019-06-21 DIAGNOSIS — M21612 Bunion of left foot: Secondary | ICD-10-CM

## 2019-06-21 NOTE — Telephone Encounter (Signed)
Pt called to confirm address of sx center. Also wanted to know the time. I told her someone would call a day or two prior to let her know what time to arrive due to changes in their schedules and if anyone is pediatric or diabetic that needs to go back first. Pt also asked if they were cancelling all elective surgeries. I told her at this time they are not doing that. Pt then stated Dr. Charlsie Merles mentioned something to her about crutches but she thinks she has torn something in her shoulder and it would be hard to put pressure on it. Wants to know if she could use a knee scooter instead of using crutches.

## 2019-06-22 ENCOUNTER — Ambulatory Visit: Payer: BC Managed Care – PPO

## 2019-06-23 NOTE — Telephone Encounter (Signed)
She could use a knee scooter for a couple of weeks. Can also bear wt. On it if she wants

## 2019-06-23 NOTE — Telephone Encounter (Signed)
Called pt to let her know Dr. Charlsie Merles said she could use a knee scooter for a couple of weeks but she could also put weight on her foot. Pt stated she is going to call her orthopaedic doctor to see if it would cause more damage to her shoulder if she was to use crutches. Pt stated she would call me back within the next few days.

## 2019-06-24 ENCOUNTER — Encounter: Payer: Medicare PPO | Admitting: Podiatry

## 2019-07-01 ENCOUNTER — Ambulatory Visit: Payer: BC Managed Care – PPO

## 2019-07-01 NOTE — Telephone Encounter (Signed)
Pt called and states she had an MRI done on her shoulder and she has re-torn her rotator cuff. Pt is requesting an Rx for a knee scooter that Dr. Charlsie Merles approved.

## 2019-07-02 ENCOUNTER — Other Ambulatory Visit: Payer: Self-pay

## 2019-07-02 ENCOUNTER — Ambulatory Visit
Admission: RE | Admit: 2019-07-02 | Discharge: 2019-07-02 | Disposition: A | Discharge status: Home / Self Care | Payer: Medicare PPO | Source: Ambulatory Visit | Attending: Obstetrics and Gynecology | Admitting: Obstetrics and Gynecology

## 2019-07-02 DIAGNOSIS — Z1231 Encounter for screening mammogram for malignant neoplasm of breast: Secondary | ICD-10-CM

## 2019-07-02 NOTE — Addendum Note (Signed)
Addended by: Enedina Finner on: 07/02/2019 04:20 PM   Modules accepted: Orders

## 2019-07-03 ENCOUNTER — Ambulatory Visit: Payer: BC Managed Care – PPO

## 2019-07-05 ENCOUNTER — Telehealth: Payer: Self-pay | Admitting: Podiatry

## 2019-07-05 MED ORDER — OXYCODONE-ACETAMINOPHEN 10-325 MG PO TABS: 1.0000 | ORAL_TABLET | ORAL | 0 refills | Status: DC | PRN

## 2019-07-05 MED ORDER — ONDANSETRON HCL 4 MG PO TABS: 4.0000 mg | ORAL_TABLET | Freq: Three times a day (TID) | ORAL | 0 refills | Status: DC | PRN

## 2019-07-05 NOTE — Telephone Encounter (Signed)
Left voicemail letting pt know order has been signed and someone from [employer] should be calling her about her knee scooter. I apologized for the delay and told her if she needed to speak with me, she could call me back directly.

## 2019-07-05 NOTE — Telephone Encounter (Signed)
Pt called asking about rx for knee scooter for surgry 07/06/19

## 2019-07-05 NOTE — Addendum Note (Signed)
Addended by: Lenn Sink on: 07/05/2019 04:41 PM   Modules accepted: Orders

## 2019-07-06 ENCOUNTER — Encounter: Payer: Self-pay | Admitting: *Deleted

## 2019-07-06 ENCOUNTER — Telehealth: Payer: Self-pay | Admitting: Podiatry

## 2019-07-06 ENCOUNTER — Other Ambulatory Visit: Payer: Self-pay | Admitting: Sports Medicine

## 2019-07-06 DIAGNOSIS — M2042 Other hammer toe(s) (acquired), left foot: Secondary | ICD-10-CM

## 2019-07-06 DIAGNOSIS — M2012 Hallux valgus (acquired), left foot: Secondary | ICD-10-CM | POA: Diagnosis not present

## 2019-07-06 NOTE — Progress Notes (Signed)
Spoke with Herbert Seta pharmacist re: pain medication that Dr. Charlsie Merles wrote for Percocet for patient since patient is opoid naive medication was adjusted to q8h prn pain instead of q4 -Dr. Marylene Land

## 2019-07-06 NOTE — Telephone Encounter (Signed)
Pharmacy called wanting to discuss pain medication for pt. Spoke nto Dr.Wagoner he will call pharmacy back

## 2019-07-08 ENCOUNTER — Encounter: Payer: Medicare PPO | Admitting: Podiatry

## 2019-07-14 ENCOUNTER — Encounter: Payer: Self-pay | Admitting: Podiatry

## 2019-07-14 ENCOUNTER — Ambulatory Visit (INDEPENDENT_AMBULATORY_CARE_PROVIDER_SITE_OTHER): Payer: Medicare PPO

## 2019-07-14 ENCOUNTER — Ambulatory Visit (INDEPENDENT_AMBULATORY_CARE_PROVIDER_SITE_OTHER): Payer: Medicare PPO | Admitting: Podiatry

## 2019-07-14 ENCOUNTER — Other Ambulatory Visit: Payer: Self-pay

## 2019-07-14 ENCOUNTER — Other Ambulatory Visit: Payer: Self-pay | Admitting: Podiatry

## 2019-07-14 DIAGNOSIS — M2042 Other hammer toe(s) (acquired), left foot: Secondary | ICD-10-CM | POA: Diagnosis not present

## 2019-07-14 DIAGNOSIS — M2012 Hallux valgus (acquired), left foot: Secondary | ICD-10-CM

## 2019-07-14 DIAGNOSIS — M21611 Bunion of right foot: Secondary | ICD-10-CM

## 2019-07-14 DIAGNOSIS — M21612 Bunion of left foot: Secondary | ICD-10-CM

## 2019-07-14 DIAGNOSIS — Z1509 Genetic susceptibility to other malignant neoplasm: Secondary | ICD-10-CM | POA: Insufficient documentation

## 2019-07-14 DIAGNOSIS — Z1501 Genetic susceptibility to malignant neoplasm of breast: Secondary | ICD-10-CM | POA: Insufficient documentation

## 2019-07-14 NOTE — Progress Notes (Signed)
Subjective:   Patient ID: Ashley Pace, female   DOB: 64 y.o.   MRN: [id number]   HPI Patient presents stating doing well with surgery and very pleased with how things are going at this point   ROS      Objective:  Physical Exam  Neurovascular status intact negative Denna Haggard' sign noted patient's left foot is healing well wound edges well coapted hallux in rectus position digits in good alignment     Assessment:  Doing well post osteotomy digital correction     Plan:  H&P conditions reviewed and recommended continued elevation compression immobilization and dispensed surgical shoe which she may gradually begin wearing.  Patient will be seen back to recheck  X-rays indicate the osteotomy is healing well fixation in place joint congruence

## 2019-07-16 ENCOUNTER — Telehealth: Payer: Self-pay | Admitting: *Deleted

## 2019-07-16 NOTE — Telephone Encounter (Signed)
Patient called and stated that she had surgery on 07-06-2019 with Dr Charlsie Merles and the little toe was hurting some and I stated that is normal and to try and ice and elevate if needed and to stay off of it and not be on it more than 15 minutes on the hour and the patient stated that the first couple of nights after surgery was painful and it should really not be but I reassured the patient that this was normal and if the patient did not feel any better by the end of the weekend to call and we would get the patient in with Dr Charlsie Merles and to call the office if any concerns or questions. Misty Stanley

## 2019-07-20 ENCOUNTER — Telehealth: Payer: Self-pay | Admitting: *Deleted

## 2019-07-20 NOTE — Telephone Encounter (Signed)
I informed pt that at this point in her recovery ice and elevation would be beneficial for periods of swelling and pain, and the compression wrap would need to be continued until seen by Dr. Charlsie Merles.

## 2019-07-20 NOTE — Telephone Encounter (Signed)
Pt states she had surgery 07/06/2019 with Dr. Charlsie Merles for bunion and two toes, and wanted to know if she needed to continue to ice every hour, elevate and wear the compression until seen in office 07/2019.

## 2019-07-22 ENCOUNTER — Ambulatory Visit: Payer: BC Managed Care – PPO

## 2019-07-22 ENCOUNTER — Encounter: Payer: Medicare PPO | Admitting: Podiatry

## 2019-07-28 ENCOUNTER — Other Ambulatory Visit: Payer: Self-pay | Admitting: Obstetrics and Gynecology

## 2019-07-28 DIAGNOSIS — Z1501 Genetic susceptibility to malignant neoplasm of breast: Secondary | ICD-10-CM

## 2019-08-04 ENCOUNTER — Ambulatory Visit (INDEPENDENT_AMBULATORY_CARE_PROVIDER_SITE_OTHER): Payer: Medicare PPO

## 2019-08-04 ENCOUNTER — Ambulatory Visit (INDEPENDENT_AMBULATORY_CARE_PROVIDER_SITE_OTHER): Payer: Medicare PPO | Admitting: Podiatry

## 2019-08-04 ENCOUNTER — Other Ambulatory Visit: Payer: Self-pay

## 2019-08-04 ENCOUNTER — Encounter: Payer: Self-pay | Admitting: Podiatry

## 2019-08-04 VITALS — Temp 98.2°F

## 2019-08-04 DIAGNOSIS — Z9889 Other specified postprocedural states: Secondary | ICD-10-CM

## 2019-08-04 DIAGNOSIS — M2042 Other hammer toe(s) (acquired), left foot: Secondary | ICD-10-CM

## 2019-08-04 DIAGNOSIS — M2041 Other hammer toe(s) (acquired), right foot: Secondary | ICD-10-CM

## 2019-08-04 DIAGNOSIS — L84 Corns and callosities: Secondary | ICD-10-CM

## 2019-08-04 NOTE — Progress Notes (Signed)
Subjective:   Patient ID: Ashley Pace, female   DOB: 64 y.o.   MRN: [id number]   HPI Patient presents stating that she is getting some discomfort underneath the first metatarsal but other than that is been doing well and has been really working on her range of motion and is pleased with surgical results   ROS      Objective:  Physical Exam  It was status intact negative Denna Haggard' sign noted wound edges well coapted excellent range of motion of the first MPJ with good digital position 4 and fifth toe left with stitches intact wound edges well coapted     Assessment:  Doing well post surgical correction of the left foot     Plan:  H&P reviewed condition and at this point I removed stitches wound edges were well coapted after removal I discussed continued range of motion gradual return to soft shoe gear reviewed x-rays and dispensed ankle compression stocking  X-rays indicate osteotomy is healing well good alignment noted fixation in place joint congruence

## 2019-08-23 ENCOUNTER — Other Ambulatory Visit: Payer: Self-pay

## 2019-08-23 ENCOUNTER — Ambulatory Visit
Admission: RE | Admit: 2019-08-23 | Discharge: 2019-08-23 | Disposition: A | Discharge status: Home / Self Care | Payer: Medicare PPO | Source: Ambulatory Visit | Attending: Obstetrics and Gynecology | Admitting: Obstetrics and Gynecology

## 2019-08-23 ENCOUNTER — Telehealth: Payer: Self-pay

## 2019-08-23 DIAGNOSIS — Z1501 Genetic susceptibility to malignant neoplasm of breast: Secondary | ICD-10-CM

## 2019-08-23 NOTE — Telephone Encounter (Signed)
13 hour prep Rx called in as follows: Prednisone 50mg  PO 09/13/19 @ 2050, 09/14/19 @ 0250 and 0850.  Benadryl 50mg  PO 09/14/19 @ 0850.  LMOM with patient that this has been called in and gave dosing instructions, as well as my call-back number. , RN

## 2019-08-25 ENCOUNTER — Telehealth: Payer: Self-pay | Admitting: *Deleted

## 2019-08-25 NOTE — Telephone Encounter (Signed)
Pt called asked how long should she continue the compression sleeve, she has an appt 09/15/2019.

## 2019-08-25 NOTE — Telephone Encounter (Signed)
I spoke with pt and informed that she could try to go without the compression sleeve/sock and if she had swelling again she should begin the compression sock the following day. Pt states that is what she is doing.

## 2019-09-03 ENCOUNTER — Telehealth: Payer: Self-pay | Admitting: *Deleted

## 2019-09-03 NOTE — Telephone Encounter (Signed)
Pt states she had surgery 07/06/2019 and walks her dog 1.5 miles a day and then additional miles that may equal 3-4 miles a day, but other people state she should not be walking that much.

## 2019-09-03 NOTE — Telephone Encounter (Signed)
I spoke with pt and asked if she had any pain or swelling and she denied. I told pt that we did not routinely have pt with this much activity at this point in their recovery and she may not want to push the foot and should only perform the dog walking activity and then acts of daily living, until she discussed with Dr. Charlsie Merles.. I told pt to rest, ice and elevate with periods of discomfort or swelling. Pt states understanding.

## 2019-09-06 NOTE — Telephone Encounter (Signed)
She can increase activity to her toleration. I'm fine with walking additional miles if she does not have problems

## 2019-09-14 ENCOUNTER — Ambulatory Visit
Admission: RE | Admit: 2019-09-14 | Discharge: 2019-09-14 | Disposition: A | Discharge status: Home / Self Care | Payer: Medicare PPO | Source: Ambulatory Visit | Attending: Obstetrics and Gynecology | Admitting: Obstetrics and Gynecology

## 2019-09-14 ENCOUNTER — Other Ambulatory Visit: Payer: Self-pay

## 2019-09-14 MED ORDER — GADOBUTROL 1 MMOL/ML IV SOLN
7.0000 mL | Freq: Once | INTRAVENOUS | Status: AC | PRN
  Administered 2019-09-14: 7 mL via INTRAVENOUS

## 2019-09-15 ENCOUNTER — Ambulatory Visit (INDEPENDENT_AMBULATORY_CARE_PROVIDER_SITE_OTHER): Payer: Medicare PPO

## 2019-09-15 ENCOUNTER — Ambulatory Visit (INDEPENDENT_AMBULATORY_CARE_PROVIDER_SITE_OTHER): Payer: Medicare PPO | Admitting: Podiatry

## 2019-09-15 ENCOUNTER — Other Ambulatory Visit: Payer: Self-pay

## 2019-09-15 ENCOUNTER — Encounter: Payer: Self-pay | Admitting: Podiatry

## 2019-09-15 VITALS — Temp 97.6°F

## 2019-09-15 DIAGNOSIS — M2042 Other hammer toe(s) (acquired), left foot: Secondary | ICD-10-CM

## 2019-09-15 DIAGNOSIS — M779 Enthesopathy, unspecified: Secondary | ICD-10-CM

## 2019-09-15 DIAGNOSIS — Z9889 Other specified postprocedural states: Secondary | ICD-10-CM

## 2019-09-15 DIAGNOSIS — M778 Other enthesopathies, not elsewhere classified: Secondary | ICD-10-CM | POA: Diagnosis not present

## 2019-09-15 NOTE — Progress Notes (Signed)
Subjective:   Patient ID: Ashley Pace, female   DOB: 64 y.o.   MRN: [id number]   HPI Patient states overall doing pretty well but I do get some inflammation in this joint if I am on it too much and I did get some discomfort in the bottom of the foot.  Overall doing pretty well for 10 weeks after surgery   ROS      Objective:  Physical Exam  Neurovascular status intact negative Denna Haggard' sign noted excellent healing surgical site first MPJ left excellent range of motion no crepitus with mild inflammation around the second MPJ      Assessment:  Capsulitis second MPJ left which may be due to increased blood flow with excellent healing osteotomy first left     Plan:  H&P reviewed condition did careful injection around the second MPJ 2 mg dexamethasone 3 mg Xylocaine advised on padding and applied a pad to reduce pressure on the joint surface and reappoint 6 weeks or earlier if needed  X-ray indicates the osteotomy is healing well fixation in place joint congruence

## 2019-10-27 ENCOUNTER — Ambulatory Visit (INDEPENDENT_AMBULATORY_CARE_PROVIDER_SITE_OTHER): Payer: Medicare PPO

## 2019-10-27 ENCOUNTER — Other Ambulatory Visit: Payer: Self-pay

## 2019-10-27 ENCOUNTER — Ambulatory Visit (INDEPENDENT_AMBULATORY_CARE_PROVIDER_SITE_OTHER): Payer: Medicare PPO | Admitting: Podiatry

## 2019-10-27 VITALS — Temp 96.3°F

## 2019-10-27 DIAGNOSIS — M2042 Other hammer toe(s) (acquired), left foot: Secondary | ICD-10-CM | POA: Diagnosis not present

## 2019-10-27 DIAGNOSIS — M722 Plantar fascial fibromatosis: Secondary | ICD-10-CM

## 2019-10-27 DIAGNOSIS — M2041 Other hammer toe(s) (acquired), right foot: Secondary | ICD-10-CM | POA: Diagnosis not present

## 2019-10-27 NOTE — Progress Notes (Signed)
Subjective:   Patient ID: Ashley Pace, female   DOB: 64 y.o.   MRN: [id number]   HPI Patient states overall doing pretty well with stating that the second toe does seem to deviate over top the big toe at times   ROS      Objective:  Physical Exam  Neurovascular status intact with patient noted to have good healing of the osteotomy site first metatarsal left with digit in reasonably good alignment and only mild discomfort noted currently     Assessment:  Difficult to ascertain whether or not this is just some low-grade capsulitis versus possibility of flexor plate dislocation second MPJ     Plan:  H&P reviewed conditions and at this point I have recommended the continuation of padding therapy rigid bottom shoes and I am hopeful this will be the end of the inflammatory condition patient is experiencing.  If it persist we will need to consider other treatments for this  X-ray indicates osteotomy is healed well second digit is medially dislocated but no other pathology

## 2020-05-18 ENCOUNTER — Other Ambulatory Visit: Payer: Self-pay | Admitting: Obstetrics and Gynecology

## 2020-05-18 DIAGNOSIS — Z1231 Encounter for screening mammogram for malignant neoplasm of breast: Secondary | ICD-10-CM

## 2020-07-03 ENCOUNTER — Other Ambulatory Visit: Payer: Self-pay

## 2020-07-03 ENCOUNTER — Ambulatory Visit
Admission: RE | Admit: 2020-07-03 | Discharge: 2020-07-03 | Disposition: A | Discharge status: Home / Self Care | Payer: Medicare PPO | Source: Ambulatory Visit | Attending: Obstetrics and Gynecology | Admitting: Obstetrics and Gynecology

## 2020-07-03 DIAGNOSIS — Z1231 Encounter for screening mammogram for malignant neoplasm of breast: Secondary | ICD-10-CM | POA: Diagnosis not present

## 2020-07-03 DIAGNOSIS — Z01419 Encounter for gynecological examination (general) (routine) without abnormal findings: Secondary | ICD-10-CM | POA: Diagnosis not present

## 2020-07-03 DIAGNOSIS — Z1501 Genetic susceptibility to malignant neoplasm of breast: Secondary | ICD-10-CM | POA: Diagnosis not present

## 2020-07-03 DIAGNOSIS — R971 Elevated cancer antigen 125 [CA 125]: Secondary | ICD-10-CM | POA: Diagnosis not present

## 2020-07-21 DIAGNOSIS — Z8041 Family history of malignant neoplasm of ovary: Secondary | ICD-10-CM | POA: Diagnosis not present

## 2020-08-05 DIAGNOSIS — M545 Low back pain, unspecified: Secondary | ICD-10-CM | POA: Diagnosis not present

## 2020-08-05 DIAGNOSIS — M25552 Pain in left hip: Secondary | ICD-10-CM | POA: Diagnosis not present

## 2020-08-14 ENCOUNTER — Other Ambulatory Visit: Payer: Self-pay | Admitting: Obstetrics and Gynecology

## 2020-08-14 DIAGNOSIS — Z1501 Genetic susceptibility to malignant neoplasm of breast: Secondary | ICD-10-CM

## 2020-08-14 DIAGNOSIS — Z1509 Genetic susceptibility to other malignant neoplasm: Secondary | ICD-10-CM

## 2020-08-17 DIAGNOSIS — M6281 Muscle weakness (generalized): Secondary | ICD-10-CM | POA: Diagnosis not present

## 2020-08-17 DIAGNOSIS — M25652 Stiffness of left hip, not elsewhere classified: Secondary | ICD-10-CM | POA: Diagnosis not present

## 2020-08-17 DIAGNOSIS — M7072 Other bursitis of hip, left hip: Secondary | ICD-10-CM | POA: Diagnosis not present

## 2020-08-24 DIAGNOSIS — M25652 Stiffness of left hip, not elsewhere classified: Secondary | ICD-10-CM | POA: Diagnosis not present

## 2020-08-24 DIAGNOSIS — M6281 Muscle weakness (generalized): Secondary | ICD-10-CM | POA: Diagnosis not present

## 2020-08-24 DIAGNOSIS — M7072 Other bursitis of hip, left hip: Secondary | ICD-10-CM | POA: Diagnosis not present

## 2020-08-28 DIAGNOSIS — M6281 Muscle weakness (generalized): Secondary | ICD-10-CM | POA: Diagnosis not present

## 2020-08-28 DIAGNOSIS — M7072 Other bursitis of hip, left hip: Secondary | ICD-10-CM | POA: Diagnosis not present

## 2020-08-28 DIAGNOSIS — M25652 Stiffness of left hip, not elsewhere classified: Secondary | ICD-10-CM | POA: Diagnosis not present

## 2020-08-30 DIAGNOSIS — M6281 Muscle weakness (generalized): Secondary | ICD-10-CM | POA: Diagnosis not present

## 2020-08-30 DIAGNOSIS — M7062 Trochanteric bursitis, left hip: Secondary | ICD-10-CM | POA: Diagnosis not present

## 2020-08-30 DIAGNOSIS — M25652 Stiffness of left hip, not elsewhere classified: Secondary | ICD-10-CM | POA: Diagnosis not present

## 2020-09-04 DIAGNOSIS — M25652 Stiffness of left hip, not elsewhere classified: Secondary | ICD-10-CM | POA: Diagnosis not present

## 2020-09-04 DIAGNOSIS — M6281 Muscle weakness (generalized): Secondary | ICD-10-CM | POA: Diagnosis not present

## 2020-09-04 DIAGNOSIS — M7072 Other bursitis of hip, left hip: Secondary | ICD-10-CM | POA: Diagnosis not present

## 2020-09-05 ENCOUNTER — Ambulatory Visit
Admission: RE | Admit: 2020-09-05 | Discharge: 2020-09-05 | Disposition: A | Discharge status: Home / Self Care | Payer: Medicare PPO | Source: Ambulatory Visit | Attending: Obstetrics and Gynecology | Admitting: Obstetrics and Gynecology

## 2020-09-05 ENCOUNTER — Other Ambulatory Visit: Payer: Self-pay

## 2020-09-05 DIAGNOSIS — Z1239 Encounter for other screening for malignant neoplasm of breast: Secondary | ICD-10-CM | POA: Diagnosis not present

## 2020-09-05 DIAGNOSIS — Z803 Family history of malignant neoplasm of breast: Secondary | ICD-10-CM | POA: Diagnosis not present

## 2020-09-05 DIAGNOSIS — Z1509 Genetic susceptibility to other malignant neoplasm: Secondary | ICD-10-CM

## 2020-09-05 DIAGNOSIS — Z1501 Genetic susceptibility to malignant neoplasm of breast: Secondary | ICD-10-CM

## 2020-09-05 MED ORDER — GADOBUTROL 1 MMOL/ML IV SOLN
6.0000 mL | Freq: Once | INTRAVENOUS | Status: AC | PRN
  Administered 2020-09-05: 6 mL via INTRAVENOUS

## 2020-09-06 DIAGNOSIS — M6281 Muscle weakness (generalized): Secondary | ICD-10-CM | POA: Diagnosis not present

## 2020-09-06 DIAGNOSIS — M25652 Stiffness of left hip, not elsewhere classified: Secondary | ICD-10-CM | POA: Diagnosis not present

## 2020-09-06 DIAGNOSIS — M7072 Other bursitis of hip, left hip: Secondary | ICD-10-CM | POA: Diagnosis not present

## 2020-09-21 DIAGNOSIS — M25652 Stiffness of left hip, not elsewhere classified: Secondary | ICD-10-CM | POA: Diagnosis not present

## 2020-09-21 DIAGNOSIS — M7072 Other bursitis of hip, left hip: Secondary | ICD-10-CM | POA: Diagnosis not present

## 2020-09-21 DIAGNOSIS — M6281 Muscle weakness (generalized): Secondary | ICD-10-CM | POA: Diagnosis not present

## 2020-09-26 DIAGNOSIS — M6281 Muscle weakness (generalized): Secondary | ICD-10-CM | POA: Diagnosis not present

## 2020-09-26 DIAGNOSIS — M7072 Other bursitis of hip, left hip: Secondary | ICD-10-CM | POA: Diagnosis not present

## 2020-09-26 DIAGNOSIS — M25652 Stiffness of left hip, not elsewhere classified: Secondary | ICD-10-CM | POA: Diagnosis not present

## 2020-09-28 DIAGNOSIS — M6281 Muscle weakness (generalized): Secondary | ICD-10-CM | POA: Diagnosis not present

## 2020-09-28 DIAGNOSIS — M25652 Stiffness of left hip, not elsewhere classified: Secondary | ICD-10-CM | POA: Diagnosis not present

## 2020-09-28 DIAGNOSIS — M7072 Other bursitis of hip, left hip: Secondary | ICD-10-CM | POA: Diagnosis not present

## 2020-10-03 DIAGNOSIS — M25652 Stiffness of left hip, not elsewhere classified: Secondary | ICD-10-CM | POA: Diagnosis not present

## 2020-10-03 DIAGNOSIS — M6281 Muscle weakness (generalized): Secondary | ICD-10-CM | POA: Diagnosis not present

## 2020-10-03 DIAGNOSIS — M7072 Other bursitis of hip, left hip: Secondary | ICD-10-CM | POA: Diagnosis not present

## 2020-10-05 DIAGNOSIS — M25652 Stiffness of left hip, not elsewhere classified: Secondary | ICD-10-CM | POA: Diagnosis not present

## 2020-10-05 DIAGNOSIS — M6281 Muscle weakness (generalized): Secondary | ICD-10-CM | POA: Diagnosis not present

## 2020-10-05 DIAGNOSIS — M7072 Other bursitis of hip, left hip: Secondary | ICD-10-CM | POA: Diagnosis not present

## 2020-10-10 DIAGNOSIS — M6281 Muscle weakness (generalized): Secondary | ICD-10-CM | POA: Diagnosis not present

## 2020-10-10 DIAGNOSIS — M25652 Stiffness of left hip, not elsewhere classified: Secondary | ICD-10-CM | POA: Diagnosis not present

## 2020-10-10 DIAGNOSIS — M7072 Other bursitis of hip, left hip: Secondary | ICD-10-CM | POA: Diagnosis not present

## 2020-10-20 DIAGNOSIS — H2513 Age-related nuclear cataract, bilateral: Secondary | ICD-10-CM | POA: Diagnosis not present

## 2020-10-20 DIAGNOSIS — H5213 Myopia, bilateral: Secondary | ICD-10-CM | POA: Diagnosis not present

## 2020-12-29 DIAGNOSIS — M76822 Posterior tibial tendinitis, left leg: Secondary | ICD-10-CM | POA: Diagnosis not present

## 2021-02-06 DIAGNOSIS — E7800 Pure hypercholesterolemia, unspecified: Secondary | ICD-10-CM | POA: Diagnosis not present

## 2021-02-06 DIAGNOSIS — Z79899 Other long term (current) drug therapy: Secondary | ICD-10-CM | POA: Diagnosis not present

## 2021-02-06 DIAGNOSIS — E039 Hypothyroidism, unspecified: Secondary | ICD-10-CM | POA: Diagnosis not present

## 2021-02-13 DIAGNOSIS — Z0000 Encounter for general adult medical examination without abnormal findings: Secondary | ICD-10-CM | POA: Diagnosis not present

## 2021-02-13 DIAGNOSIS — J309 Allergic rhinitis, unspecified: Secondary | ICD-10-CM | POA: Diagnosis not present

## 2021-02-13 DIAGNOSIS — Z23 Encounter for immunization: Secondary | ICD-10-CM | POA: Diagnosis not present

## 2021-02-13 DIAGNOSIS — E039 Hypothyroidism, unspecified: Secondary | ICD-10-CM | POA: Diagnosis not present

## 2021-02-13 DIAGNOSIS — E7800 Pure hypercholesterolemia, unspecified: Secondary | ICD-10-CM | POA: Diagnosis not present

## 2021-02-13 DIAGNOSIS — D485 Neoplasm of uncertain behavior of skin: Secondary | ICD-10-CM | POA: Diagnosis not present

## 2021-03-06 ENCOUNTER — Ambulatory Visit: Payer: Medicare PPO | Admitting: Sports Medicine

## 2021-03-06 VITALS — Ht 65.0 in | Wt 145.0 lb

## 2021-03-06 DIAGNOSIS — M79672 Pain in left foot: Secondary | ICD-10-CM | POA: Diagnosis not present

## 2021-03-06 DIAGNOSIS — M25572 Pain in left ankle and joints of left foot: Secondary | ICD-10-CM

## 2021-03-06 MED ORDER — NITROGLYCERIN 0.2 MG/HR TD PT24: MEDICATED_PATCH | TRANSDERMAL | 1 refills | Status: AC

## 2021-03-06 NOTE — Progress Notes (Signed)
PCP: Lawerance Cruel, MD  Subjective:   HPI: Patient is a 66 y.o. female here for left foot pain.On July 15 she took step and twisted her foot. She points to pain at first metatarsal. She has continued to walk ambulate and slightly improved before twisting again when slipping on a kayak ramp.  Hurts more going down and up hills. Walking on flat terrain is okay. No noticeable improvement with boot placed at orthopedic office. She wore it for three weeks, and removed because she saw no improvement. Pain controlled with NSAID and ice as needed.  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       cetirizine (ZYRTEC) 10 MG tablet Take 10 mg by mouth daily.       cholecalciferol (VITAMIN D) 1000 UNITS tablet Take 1,000 Units by mouth daily.       fluticasone (FLONASE) 50 MCG/ACT nasal spray      levothyroxine (SYNTHROID) 100 MCG tablet      Multiple Vitamin (MULTIVITAMIN) capsule Take 1 capsule by mouth daily.       mupirocin ointment (BACTROBAN) 2 % mupirocin 2 % topical ointment  APPLY OINTMENT TOPICALLY TO AFFECTED AREA TWICE DAILY     predniSONE (DELTASONE) 50 MG tablet Only takes before gets an MRI procedure     Zoster Vaccine Adjuvanted [facility]) injection Shingrix (PF) 50 mcg/0.5 mL intramuscular suspension, kit     No current facility-administered medications on file prior to visit.    Past Surgical History:  Procedure Laterality Date    BREAST BIOPSY Right 2012   PASH   BREAST SURGERY     Biopsy-benign   HYSTEROSCOPY     WITH RESECTION OF POLYP   TONSILLECTOMY      Allergies  Allergen Reactions   Iodinated Diagnostic Agents    Sulfa Antibiotics     As a child   Tape    Multihance [Gadobenate] Swelling    Swelling at injection site x 2 gad administrations, pt needs 13 hr prep before contrast    LMP 05/06/2011   No flowsheet data found.  No flowsheet data found.      Objective:  Physical Exam:  Gen: NAD, comfortable in exam room right ankle No gross deformity, swelling, ecchymoses FROM TTP at navicular and just proximal Very mild swelling No bruising negative ant drawer and negative talar tilt.   Negative syndesmotic compression. Platnar flexion with Thom[place]ons  NV intact distally.  Ultrasound left foot:   Post. Tibialis intact/ Flex dig intact/ Flex hallucis intact At insertion of post. Tibialis to navicular there is hypoechoic change Anterior to medial malleolus coming off tibialis anterior tendon there is a separate tendon slip that attaches to dorsum of navicular There are hyperechoic fragments and hypoechoic swelling suggesting avulsion at this area which corresponds to her PMT  Tendonous avulsion at navicular   Ultrasound and interpretation by Wolfgang Phoenix. Fields, MD  Assessment & Plan:  1. Left foot pain Pain under medial malleolus suggest partial tear of tibialis posterior. Further evaluation with ultrasound and endorsed tenderness above malleolus suggest tibialis anterior tendon avulsion. - Nitroglycerin patch as below to promote healing. - Given ankle sleeve and  sports insole inserts to off load tendon - Given exercises for strengthening  - Follow up in 1 month  - nitroGLYCERIN (NITRODUR - DOSED IN MG/24 HR) 0.2 mg/hr patch; Use 1/4 patch daily to the affected area.  Dispense: 30 patch; Refill: 1  I observed and examined the patient with the resident and agree with assessment and plan.   Note reviewed and modified by me. Ila Mcgill, MD

## 2021-03-06 NOTE — Patient Instructions (Addendum)
It was great to see you today!  -Ice 15 minutes a few times a day. -Do the exercises we showed you. -Wear the ankle compression sleeve with exercise. -Use the inserts we gave you. -Start using nitro patches  Good luck in [place]!!   Nitroglycerin Protocol  Apply 1/4 nitroglycerin patch to affected area daily. Change position of patch within the affected area every 24 hours. You may experience a headache during the first 1-2 weeks of using the patch, these should subside. If you experience headaches after beginning nitroglycerin patch treatment, you may take your preferred over the counter pain reliever. Another side effect of the nitroglycerin patch is skin irritation or rash related to patch adhesive. Please notify our office if you develop more severe headaches or rash, and stop the patch. Tendon healing with nitroglycerin patch may require 12 to 24 weeks depending on the extent of injury. Men should not use if taking Viagra, Cialis, or Levitra.  Do not use if you have migraines or rosacea.

## 2021-03-08 DIAGNOSIS — M25572 Pain in left ankle and joints of left foot: Secondary | ICD-10-CM | POA: Insufficient documentation

## 2021-03-08 NOTE — Assessment & Plan Note (Signed)
See treatment note Compression NTG protocol Foot support  Gradually add some ankle strength work

## 2021-03-15 DIAGNOSIS — J029 Acute pharyngitis, unspecified: Secondary | ICD-10-CM | POA: Diagnosis not present

## 2021-03-15 DIAGNOSIS — R059 Cough, unspecified: Secondary | ICD-10-CM | POA: Diagnosis not present

## 2021-03-15 DIAGNOSIS — J309 Allergic rhinitis, unspecified: Secondary | ICD-10-CM | POA: Diagnosis not present

## 2021-03-15 DIAGNOSIS — J0110 Acute frontal sinusitis, unspecified: Secondary | ICD-10-CM | POA: Diagnosis not present

## 2021-04-17 ENCOUNTER — Ambulatory Visit: Payer: Medicare PPO | Admitting: Sports Medicine

## 2021-04-17 DIAGNOSIS — M25572 Pain in left ankle and joints of left foot: Secondary | ICD-10-CM | POA: Diagnosis not present

## 2021-04-17 DIAGNOSIS — M79672 Pain in left foot: Secondary | ICD-10-CM

## 2021-04-17 NOTE — Progress Notes (Signed)
F/u Left foot pain  Patient seen in 5-week follow-up of left foot pain.  On ultrasound this looks like a small navicular avulsion.  This appears to come from a slip of the anterior tibialis tendon.  We have given her insole support, nitroglycerin and ankle compression sleeve.  She has been able to continue walking 4 to 5 miles a day but says the soreness is only slightly better.  The mechanism of injury was similar to an ankle sprain and her original evaluation back in June she was treated for that  Review of systems No swelling No instability  Physical exam Pleasant female in no acute distress  Left ankle has full range of motion Stable to ligamentous testing Tenderness directly over the dorsum of the navicular Resisted testing shows that the strength is still preserved No swelling  Repeat ultrasound Evulsion is again noted at the dorsum of the navicular There is some soft tissue calcification The distal tendon coming from the anterior tibialis appears to be partially torn with hypoechoic change--I cannot visualize well enough to be sure this not a complete tear  Impression: Partial tear of a slip of the anterior tibialis tendon with a small avulsion from the dorsum of the navicula  Ultrasound and interpretation by Royal Hawthorn B. Darrick Penna, MD

## 2021-04-17 NOTE — Assessment & Plan Note (Signed)
Some slight improvement at this time but she has been walking probably excessively Cut her walking to no more than 3 miles a day Continue using compression sleeve Continue nitroglycerin Ice at the end of walks  Recheck 2 months

## 2021-06-19 ENCOUNTER — Other Ambulatory Visit: Payer: Self-pay | Admitting: Obstetrics and Gynecology

## 2021-06-19 DIAGNOSIS — Z1231 Encounter for screening mammogram for malignant neoplasm of breast: Secondary | ICD-10-CM

## 2021-06-25 DIAGNOSIS — R1031 Right lower quadrant pain: Secondary | ICD-10-CM | POA: Diagnosis not present

## 2021-06-25 DIAGNOSIS — J019 Acute sinusitis, unspecified: Secondary | ICD-10-CM | POA: Diagnosis not present

## 2021-06-26 ENCOUNTER — Ambulatory Visit: Payer: Medicare PPO | Admitting: Sports Medicine

## 2021-06-26 ENCOUNTER — Ambulatory Visit: Payer: Self-pay

## 2021-06-26 ENCOUNTER — Other Ambulatory Visit: Payer: Self-pay

## 2021-06-26 VITALS — BP 108/78 | Ht 65.0 in | Wt 145.0 lb

## 2021-06-26 DIAGNOSIS — M25572 Pain in left ankle and joints of left foot: Secondary | ICD-10-CM | POA: Diagnosis not present

## 2021-06-26 NOTE — Progress Notes (Signed)
° ° °  SUBJECTIVE:   CHIEF COMPLAINT / HPI: follow up for left ankle pain/ Small avulsion at navicular  Pain improving with Nitropatch daily.  Has been using splint intermittently but most days.  Has been able to hike and complete activities.  Denies any pain with movement, no numbness, tingling or weakness.  Last u/s 11/22 showed partial tear of anterior tibialis tendon with a small avulsion from the dorsum of the navicular bone. Would like repeat ultrasound today.   PERTINENT  PMH / PSH:  Plantar Fasciitis  OBJECTIVE:   BP 108/78    Ht 5\' 5"  (1.651 m)    Wt 145 lb (65.8 kg)    LMP 05/06/2011    BMI 24.13 kg/m    General: Alert, no acute distress Ankle: - Inspection: No obvious deformity, erythema, swelling, or ecchymosis, ulcers, calluses, blisters - Palpation: No TTP at MT heads, no TTP at base of 5th MT, no TTP over cuboid, TTP  over navicular prominence, no TTP over lateral or medial malleolus.  No sign of peroneal tendon subluxation or TTP. - Strength: Normal strength with dorsiflexion, plantarflexion, inversion, and eversion of foot; flexion and extension of toes - ROM: Full ROM - Neuro/vasc: NV intact, hyperesthesia over tibial nerve - Special Tests: Negative anterior drawer, normal inversion test.  Negative syndesmotic compression.  14/02/2011 of left Ankle Posterior tibialis, flexor digitorum and flexor hallucis tendons normal at malleolus In tarsal tunnel there is a small hyperechoic avulsion attached to fibers of posterior tibialis There is near complete resolution of hypoechoic swelling noted before  Impression: Small navicular avulsion with interim resolution of swelling  Ultrasound and interpretation by [place]. Fields, MD    ASSESSMENT/PLAN:   Acute left ankle pain Improving foot pain. Slightly tender to palpation over navicular process. Office u/s performed by Dr Sibyl Parr.  Official results to follow.  Small avulsion noted at the level of the navicular bone remains stable.    -Toe walking exercises -Heel walking exercises -Heel raises with foot turning inward to avoid pronation -Wear ankle sleeve when walking/hiking on irregular surfaces -Follow up with Dr Darrick Penna as needed      Darrick Penna, MD [facility]   I observed and examined the patient with the resident and agree with assessment and plan.  Note reviewed and modified by me. [facility], MD

## 2021-06-26 NOTE — Patient Instructions (Signed)
Thank you for coming to see me today. It was a pleasure.   Toe and heal walking exercises Heal raises on stairs.  Slightly turn your foot inward while doing this Use ankle sleeve when on irregular surfaces  Voltaren gel for thumb  Please follow-up with Dr Oneida Alar as needed  If you have any questions or concerns, please do not hesitate to call the office   Best,   Carollee Leitz, MD

## 2021-06-26 NOTE — Assessment & Plan Note (Signed)
Improving foot pain. Slightly tender to palpation over navicular process. Office u/s performed by Dr Darrick Penna.  Official results to follow.  Small avulsion noted at the level of the navicular bone remains stable.   -Toe walking exercises -Heel walking exercises -Heel raises with foot turning inward to avoid pronation -Wear ankle sleeve when walking/hiking on irregular surfaces -Follow up with Dr Darrick Penna as needed

## 2021-08-10 DIAGNOSIS — R922 Inconclusive mammogram: Secondary | ICD-10-CM | POA: Diagnosis not present

## 2021-08-10 DIAGNOSIS — N6315 Unspecified lump in the right breast, overlapping quadrants: Secondary | ICD-10-CM | POA: Diagnosis not present

## 2021-08-10 DIAGNOSIS — R54 Age-related physical debility: Secondary | ICD-10-CM | POA: Diagnosis not present

## 2021-08-10 DIAGNOSIS — Z1501 Genetic susceptibility to malignant neoplasm of breast: Secondary | ICD-10-CM | POA: Diagnosis not present

## 2021-08-13 ENCOUNTER — Ambulatory Visit: Payer: Medicare PPO

## 2021-08-13 DIAGNOSIS — Z1501 Genetic susceptibility to malignant neoplasm of breast: Secondary | ICD-10-CM | POA: Diagnosis not present

## 2021-08-13 DIAGNOSIS — Z124 Encounter for screening for malignant neoplasm of cervix: Secondary | ICD-10-CM | POA: Diagnosis not present

## 2021-08-13 DIAGNOSIS — Z1272 Encounter for screening for malignant neoplasm of vagina: Secondary | ICD-10-CM | POA: Diagnosis not present

## 2021-08-13 DIAGNOSIS — Z0142 Encounter for cervical smear to confirm findings of recent normal smear following initial abnormal smear: Secondary | ICD-10-CM | POA: Diagnosis not present

## 2021-08-13 DIAGNOSIS — D259 Leiomyoma of uterus, unspecified: Secondary | ICD-10-CM | POA: Diagnosis not present

## 2021-08-13 DIAGNOSIS — Z01419 Encounter for gynecological examination (general) (routine) without abnormal findings: Secondary | ICD-10-CM | POA: Diagnosis not present

## 2021-08-13 DIAGNOSIS — Z1151 Encounter for screening for human papillomavirus (HPV): Secondary | ICD-10-CM | POA: Diagnosis not present

## 2021-08-13 DIAGNOSIS — Z8041 Family history of malignant neoplasm of ovary: Secondary | ICD-10-CM | POA: Diagnosis not present

## 2021-09-21 DIAGNOSIS — R922 Inconclusive mammogram: Secondary | ICD-10-CM | POA: Diagnosis not present

## 2021-09-21 DIAGNOSIS — R928 Other abnormal and inconclusive findings on diagnostic imaging of breast: Secondary | ICD-10-CM | POA: Diagnosis not present

## 2021-09-21 DIAGNOSIS — N6312 Unspecified lump in the right breast, upper inner quadrant: Secondary | ICD-10-CM | POA: Diagnosis not present

## 2021-10-26 DIAGNOSIS — H5213 Myopia, bilateral: Secondary | ICD-10-CM | POA: Diagnosis not present

## 2021-10-26 DIAGNOSIS — H2513 Age-related nuclear cataract, bilateral: Secondary | ICD-10-CM | POA: Diagnosis not present

## 2021-12-26 DIAGNOSIS — M25572 Pain in left ankle and joints of left foot: Secondary | ICD-10-CM | POA: Diagnosis not present

## 2021-12-26 DIAGNOSIS — M25559 Pain in unspecified hip: Secondary | ICD-10-CM | POA: Diagnosis not present

## 2021-12-26 DIAGNOSIS — C44310 Basal cell carcinoma of skin of unspecified parts of face: Secondary | ICD-10-CM | POA: Diagnosis not present

## 2021-12-26 DIAGNOSIS — R0981 Nasal congestion: Secondary | ICD-10-CM | POA: Diagnosis not present

## 2021-12-26 DIAGNOSIS — T7840XD Allergy, unspecified, subsequent encounter: Secondary | ICD-10-CM | POA: Diagnosis not present

## 2021-12-26 DIAGNOSIS — Z1501 Genetic susceptibility to malignant neoplasm of breast: Secondary | ICD-10-CM | POA: Diagnosis not present

## 2021-12-26 DIAGNOSIS — E039 Hypothyroidism, unspecified: Secondary | ICD-10-CM | POA: Diagnosis not present

## 2021-12-26 DIAGNOSIS — M545 Low back pain, unspecified: Secondary | ICD-10-CM | POA: Diagnosis not present

## 2021-12-26 DIAGNOSIS — Z9889 Other specified postprocedural states: Secondary | ICD-10-CM | POA: Diagnosis not present

## 2022-01-01 DIAGNOSIS — E039 Hypothyroidism, unspecified: Secondary | ICD-10-CM | POA: Diagnosis not present

## 2022-01-01 DIAGNOSIS — Z0000 Encounter for general adult medical examination without abnormal findings: Secondary | ICD-10-CM | POA: Diagnosis not present

## 2022-01-18 DIAGNOSIS — R0981 Nasal congestion: Secondary | ICD-10-CM | POA: Diagnosis not present

## 2022-02-28 DIAGNOSIS — D229 Melanocytic nevi, unspecified: Secondary | ICD-10-CM | POA: Diagnosis not present

## 2022-02-28 DIAGNOSIS — L718 Other rosacea: Secondary | ICD-10-CM | POA: Diagnosis not present

## 2022-02-28 DIAGNOSIS — L812 Freckles: Secondary | ICD-10-CM | POA: Diagnosis not present

## 2022-02-28 DIAGNOSIS — L570 Actinic keratosis: Secondary | ICD-10-CM | POA: Diagnosis not present

## 2022-02-28 DIAGNOSIS — L309 Dermatitis, unspecified: Secondary | ICD-10-CM | POA: Diagnosis not present

## 2022-02-28 DIAGNOSIS — L728 Other follicular cysts of the skin and subcutaneous tissue: Secondary | ICD-10-CM | POA: Diagnosis not present

## 2022-02-28 DIAGNOSIS — Z85828 Personal history of other malignant neoplasm of skin: Secondary | ICD-10-CM | POA: Diagnosis not present

## 2022-02-28 DIAGNOSIS — B351 Tinea unguium: Secondary | ICD-10-CM | POA: Diagnosis not present

## 2022-03-12 DIAGNOSIS — J45909 Unspecified asthma, uncomplicated: Secondary | ICD-10-CM | POA: Diagnosis not present

## 2022-03-12 DIAGNOSIS — Z23 Encounter for immunization: Secondary | ICD-10-CM | POA: Diagnosis not present

## 2022-03-12 DIAGNOSIS — T7840XD Allergy, unspecified, subsequent encounter: Secondary | ICD-10-CM | POA: Diagnosis not present

## 2022-03-12 DIAGNOSIS — Z136 Encounter for screening for cardiovascular disorders: Secondary | ICD-10-CM | POA: Diagnosis not present

## 2022-03-12 DIAGNOSIS — E7800 Pure hypercholesterolemia, unspecified: Secondary | ICD-10-CM | POA: Diagnosis not present

## 2022-03-12 DIAGNOSIS — Z13820 Encounter for screening for osteoporosis: Secondary | ICD-10-CM | POA: Diagnosis not present

## 2022-03-12 DIAGNOSIS — Z1509 Genetic susceptibility to other malignant neoplasm: Secondary | ICD-10-CM | POA: Diagnosis not present

## 2022-03-12 DIAGNOSIS — M722 Plantar fascial fibromatosis: Secondary | ICD-10-CM | POA: Diagnosis not present

## 2022-03-12 DIAGNOSIS — Z1501 Genetic susceptibility to malignant neoplasm of breast: Secondary | ICD-10-CM | POA: Diagnosis not present

## 2022-03-12 DIAGNOSIS — Z0000 Encounter for general adult medical examination without abnormal findings: Secondary | ICD-10-CM | POA: Diagnosis not present

## 2022-03-14 DIAGNOSIS — R928 Other abnormal and inconclusive findings on diagnostic imaging of breast: Secondary | ICD-10-CM | POA: Diagnosis not present

## 2022-03-14 DIAGNOSIS — Z1509 Genetic susceptibility to other malignant neoplasm: Secondary | ICD-10-CM | POA: Diagnosis not present

## 2022-03-14 DIAGNOSIS — Z1501 Genetic susceptibility to malignant neoplasm of breast: Secondary | ICD-10-CM | POA: Diagnosis not present

## 2022-03-15 DIAGNOSIS — L570 Actinic keratosis: Secondary | ICD-10-CM | POA: Diagnosis not present

## 2022-03-15 DIAGNOSIS — L820 Inflamed seborrheic keratosis: Secondary | ICD-10-CM | POA: Diagnosis not present

## 2022-03-15 DIAGNOSIS — L608 Other nail disorders: Secondary | ICD-10-CM | POA: Diagnosis not present

## 2022-03-15 DIAGNOSIS — L728 Other follicular cysts of the skin and subcutaneous tissue: Secondary | ICD-10-CM | POA: Diagnosis not present

## 2022-03-15 DIAGNOSIS — L578 Other skin changes due to chronic exposure to nonionizing radiation: Secondary | ICD-10-CM | POA: Diagnosis not present

## 2022-04-09 DIAGNOSIS — M8580 Other specified disorders of bone density and structure, unspecified site: Secondary | ICD-10-CM | POA: Diagnosis not present

## 2022-04-09 DIAGNOSIS — E7800 Pure hypercholesterolemia, unspecified: Secondary | ICD-10-CM | POA: Diagnosis not present

## 2022-04-09 DIAGNOSIS — I2510 Atherosclerotic heart disease of native coronary artery without angina pectoris: Secondary | ICD-10-CM | POA: Diagnosis not present

## 2022-04-09 DIAGNOSIS — E559 Vitamin D deficiency, unspecified: Secondary | ICD-10-CM | POA: Diagnosis not present

## 2022-04-10 DIAGNOSIS — R928 Other abnormal and inconclusive findings on diagnostic imaging of breast: Secondary | ICD-10-CM | POA: Diagnosis not present

## 2022-05-02 IMAGING — MR MR BREAST BILAT WO/W CM
7 of 9 series · 33 of 48 positions shown · IV contrast (6ml Gadavist)
Comparison: Previous exams including breast MRIs dated 09/14/2019,
01/03/2018 and 11/25/2014.

CLINICAL DATA: Patient with BRCA 2 gene mutation and strong family
history of breast cancer. History of benign RIGHT-sided breast
biopsies.

LABS:  Not performed at [REDACTED].
EXAM:
BILATERAL BREAST MRI WITH AND WITHOUT CONTRAST
TECHNIQUE: Multiplanar, multisequence MR images of both breasts were obtained
prior to and following the intravenous administration of 6 ml of
Gadavist

[Series 2: t2_tirm_tra ipat (a-p) · axial · 3.0mm · 0.70mm/px · 1 of 59 slices shown]
[im 1/59]
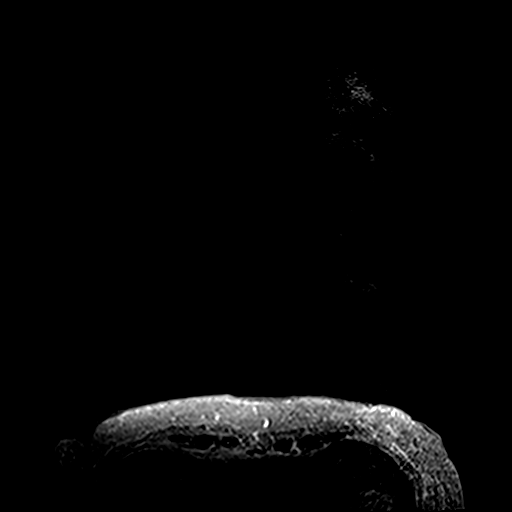

[Series 3: fl3d pre-cm no · axial · non-contrast · 1.2mm · 0.94mm/px · z∈[-63,+109]mm · 5 of 144 slices shown]
[im 1/144]
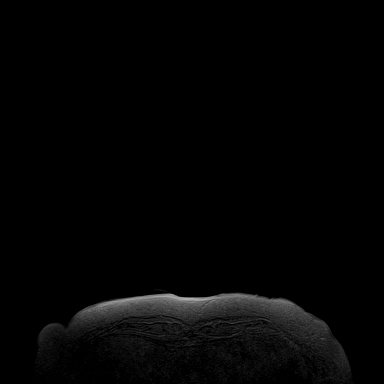
[im 36/144]
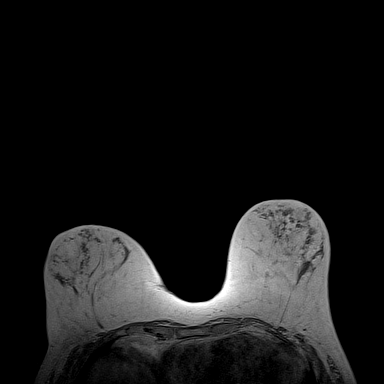
[im 72/144]
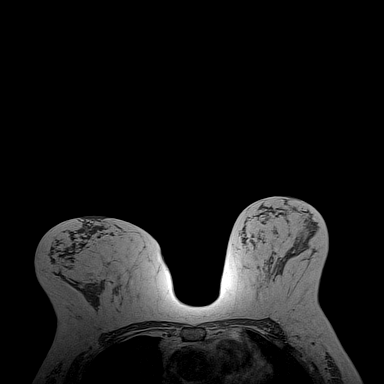
[im 108/144]
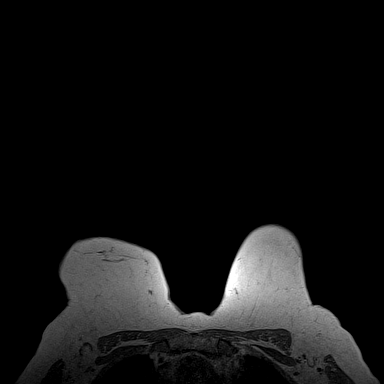
[im 144/144]
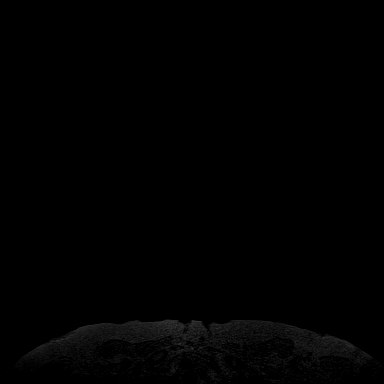

[Series 4: fl3d pre-cm · axial · non-contrast · 1.2mm · 0.94mm/px · z∈[-63,+109]mm · 6 of 144 slices shown]
[im 1/144]
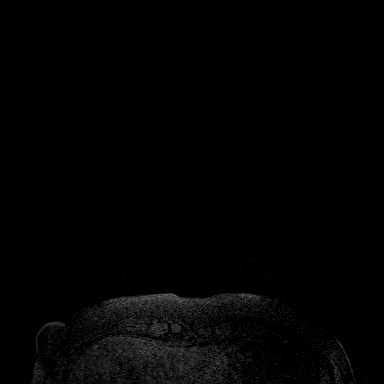
[im 29/144]
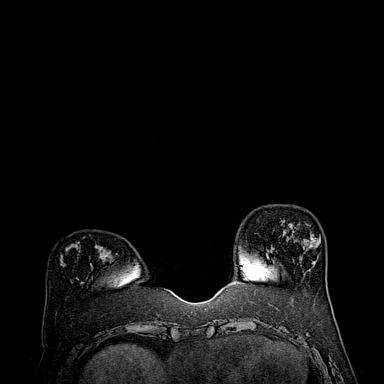
[im 58/144]
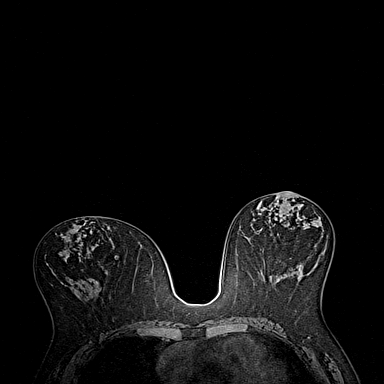
[im 86/144]
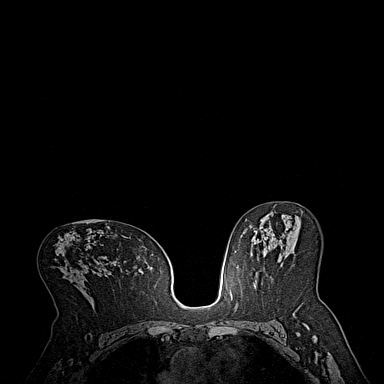
[im 115/144]
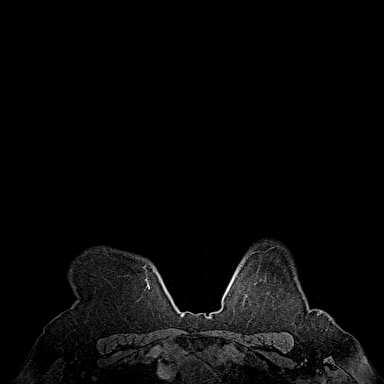
[im 144/144]
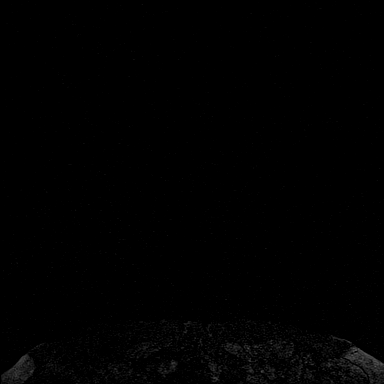

[Series 5: fl3d post-cm 20 · axial · 1.2mm · 0.94mm/px · z∈[-63,+109]mm · 6 of 144 slices shown (1 of 2)]
[im 1/144]
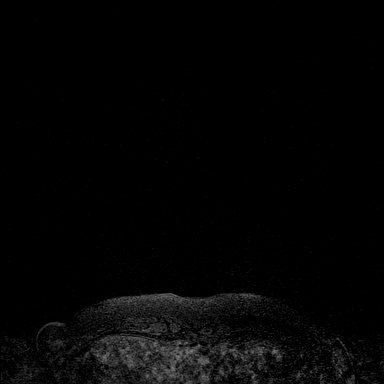
[im 29/144]
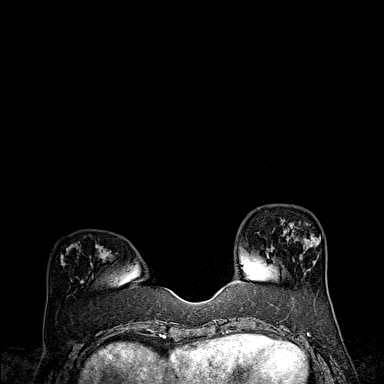
[im 58/144]
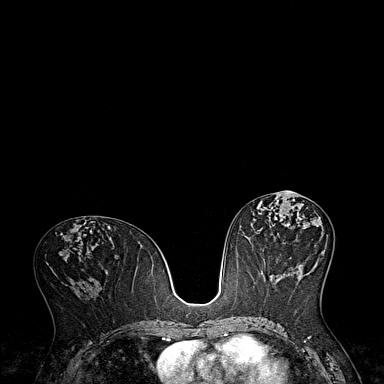
[im 86/144]
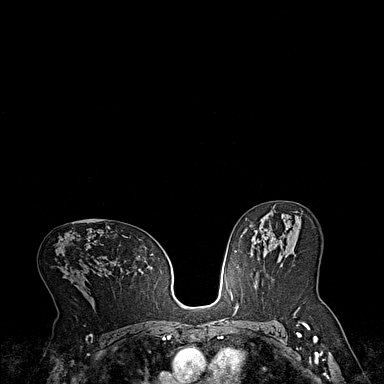
[im 115/144]
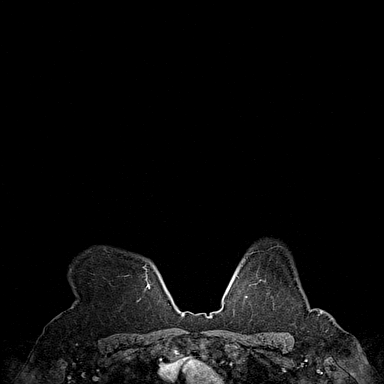
[im 144/144]
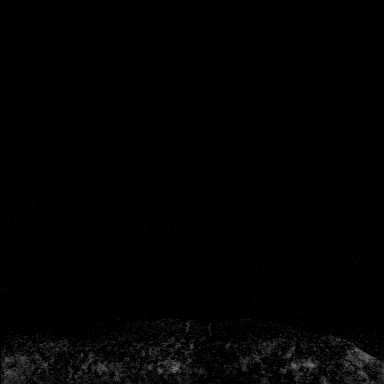

[Series 6: fl3d post-cm 20 · axial · 1.2mm · 0.94mm/px · z∈[-63,+109]mm · 6 of 144 slices shown (2 of 2)]
[im 1/144]
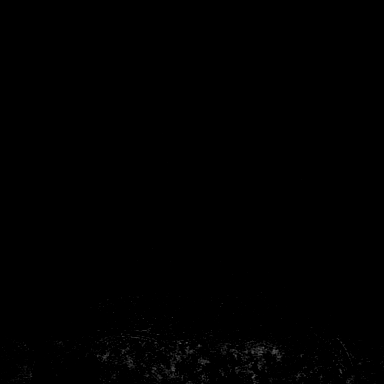
[im 29/144]
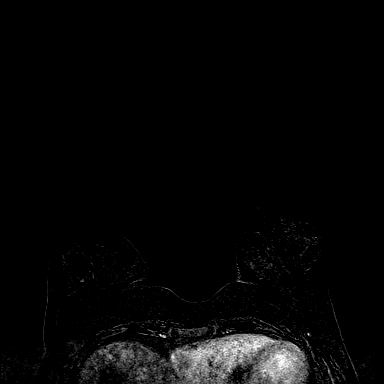
[im 58/144]
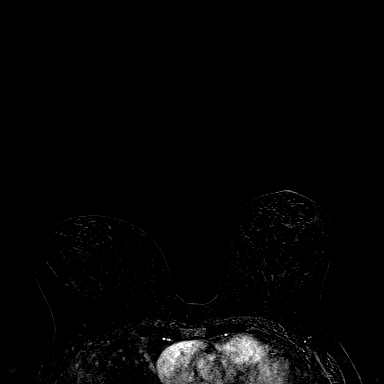
[im 86/144]
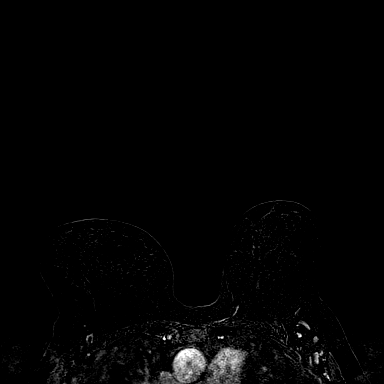
[im 115/144]
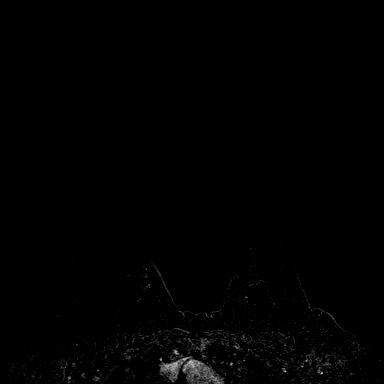
[im 144/144]
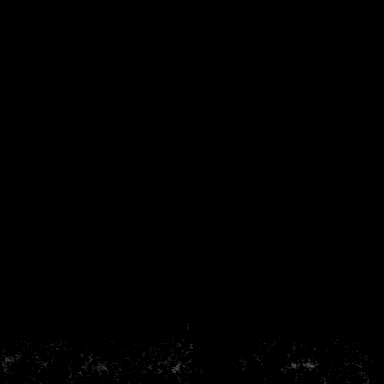

[Series 8: fl3d post-cm 3min · axial · 1.2mm · 0.94mm/px · z∈[-63,+109]mm · 6 of 144 slices shown]
[im 1/144]
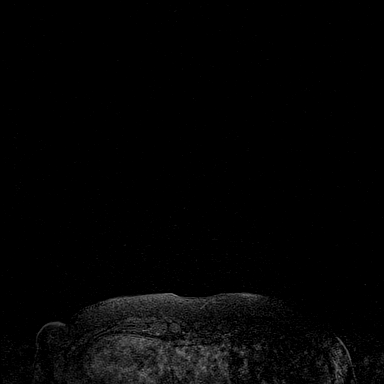
[im 29/144]
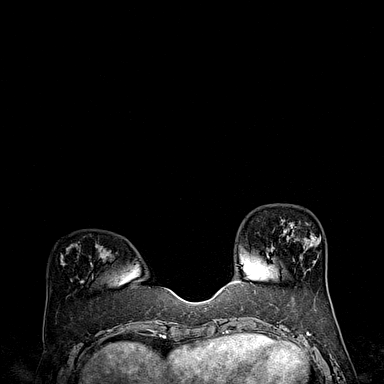
[im 58/144]
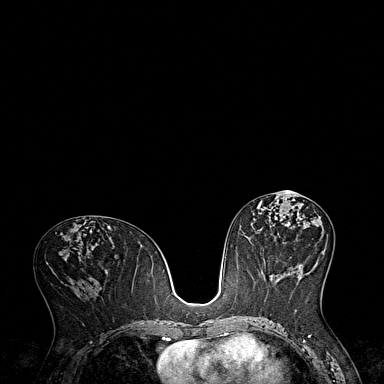
[im 86/144]
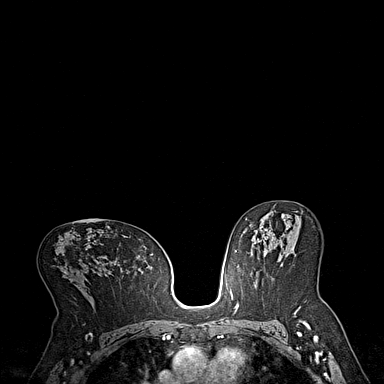
[im 115/144]
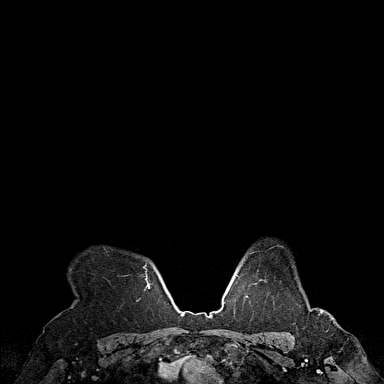
[im 144/144]
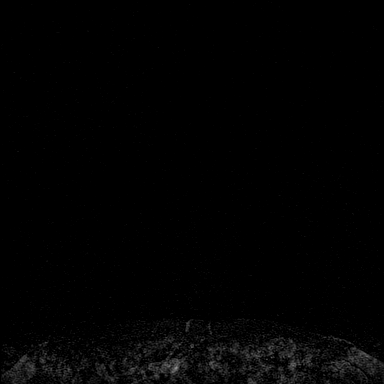

[Series 9: fl3d post-cm 3min_sub · axial · 1.2mm · 0.94mm/px · z∈[-63,+6]mm · 3 of 144 slices shown]
[im 1/144]
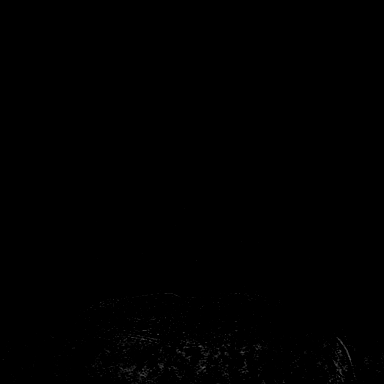
[im 29/144]
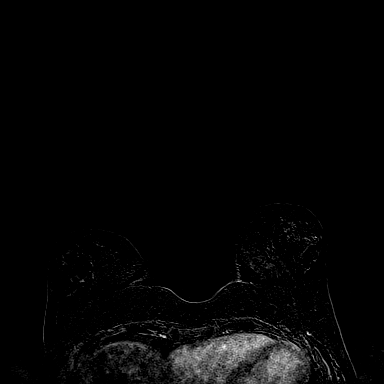
[im 58/144]
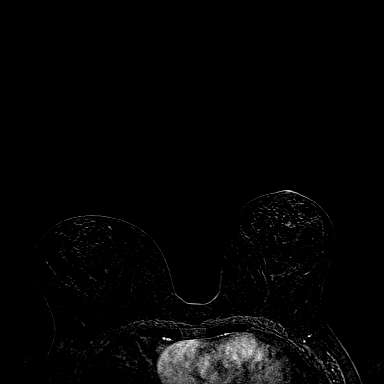

[33 of 48 positions shown; findings below may reference images not displayed]

Three-dimensional MR images were rendered by post-processing of the
original MR data on an independent workstation. The
three-dimensional MR images were interpreted, and findings are
reported in the following complete MRI report for this study. Three
dimensional images were evaluated at the independent interpreting
workstation using the DynaCAD thin client.
FINDINGS: Breast composition: c. Heterogeneous fibroglandular tissue.

Background parenchymal enhancement: Mild

Right breast: No new enhancing mass, suspicious non-mass enhancement
or secondary signs of malignancy. Biopsy site markers within the
inner RIGHT breast, corresponding to previous benign biopsy sites.

Left breast: No new enhancing mass, suspicious non-mass enhancement
or secondary signs of malignancy.

Lymph nodes: No abnormal appearing lymph nodes.

Ancillary findings:  None.
IMPRESSION: No MRI evidence of malignancy within either breast.

RECOMMENDATION:
1. Annual screening mammograms. Next bilateral screening mammogram
will be due in Sunday June, 2021.
2. Annual screening breast MRIs.

BI-RADS CATEGORY  2: Benign.

## 2022-12-10 ENCOUNTER — Other Ambulatory Visit: Payer: Self-pay | Admitting: Obstetrics and Gynecology

## 2022-12-10 DIAGNOSIS — R928 Other abnormal and inconclusive findings on diagnostic imaging of breast: Secondary | ICD-10-CM
